# Patient Record
Sex: Male | Born: 1960
Health system: Southern US, Community
[De-identification: ages and names within clinical notes are randomized; demographics above are authoritative.]

## PROBLEM LIST (undated history)

## (undated) DIAGNOSIS — E119 Type 2 diabetes mellitus without complications: Secondary | ICD-10-CM

## (undated) DIAGNOSIS — N529 Male erectile dysfunction, unspecified: Secondary | ICD-10-CM

## (undated) HISTORY — DX: Male erectile dysfunction, unspecified: N52.9

## (undated) HISTORY — PX: NO PAST SURGERIES: SHX2092

---

## 2001-08-31 ENCOUNTER — Emergency Department (HOSPITAL_COMMUNITY): Admission: EM | Admit: 2001-08-31 | Discharge: 2001-08-31 | Payer: Self-pay | Admitting: Emergency Medicine

## 2001-08-31 ENCOUNTER — Encounter: Payer: Self-pay | Admitting: Emergency Medicine

## 2001-10-10 ENCOUNTER — Ambulatory Visit (HOSPITAL_COMMUNITY): Admission: RE | Admit: 2001-10-10 | Discharge: 2001-10-10 | Payer: Self-pay | Admitting: Gastroenterology

## 2001-10-10 ENCOUNTER — Encounter: Payer: Self-pay | Admitting: Gastroenterology

## 2003-06-02 ENCOUNTER — Encounter: Admission: RE | Admit: 2003-06-02 | Discharge: 2003-06-02 | Payer: Self-pay | Admitting: Internal Medicine

## 2003-06-23 ENCOUNTER — Encounter: Admission: RE | Admit: 2003-06-23 | Discharge: 2003-06-23 | Payer: Self-pay | Admitting: Urology

## 2003-06-26 ENCOUNTER — Ambulatory Visit (HOSPITAL_BASED_OUTPATIENT_CLINIC_OR_DEPARTMENT_OTHER): Admission: RE | Admit: 2003-06-26 | Discharge: 2003-06-26 | Payer: Self-pay | Admitting: Urology

## 2003-06-26 ENCOUNTER — Encounter (INDEPENDENT_AMBULATORY_CARE_PROVIDER_SITE_OTHER): Payer: Self-pay | Admitting: Specialist

## 2003-06-26 ENCOUNTER — Ambulatory Visit (HOSPITAL_COMMUNITY): Admission: RE | Admit: 2003-06-26 | Discharge: 2003-06-26 | Payer: Self-pay | Admitting: Urology

## 2003-09-29 ENCOUNTER — Encounter: Admission: RE | Admit: 2003-09-29 | Discharge: 2003-09-29 | Payer: Self-pay | Admitting: Internal Medicine

## 2006-12-20 ENCOUNTER — Ambulatory Visit: Payer: Self-pay | Admitting: Gastroenterology

## 2007-01-19 ENCOUNTER — Ambulatory Visit: Payer: Self-pay | Admitting: Gastroenterology

## 2007-03-30 ENCOUNTER — Ambulatory Visit: Payer: Self-pay | Admitting: Gastroenterology

## 2007-06-12 ENCOUNTER — Ambulatory Visit: Payer: Self-pay | Admitting: Gastroenterology

## 2007-08-17 DIAGNOSIS — K5732 Diverticulitis of large intestine without perforation or abscess without bleeding: Secondary | ICD-10-CM | POA: Insufficient documentation

## 2007-08-17 DIAGNOSIS — G4730 Sleep apnea, unspecified: Secondary | ICD-10-CM | POA: Insufficient documentation

## 2007-08-17 DIAGNOSIS — F1010 Alcohol abuse, uncomplicated: Secondary | ICD-10-CM | POA: Insufficient documentation

## 2008-09-12 ENCOUNTER — Telehealth: Payer: Self-pay | Admitting: Gastroenterology

## 2010-10-05 NOTE — Assessment & Plan Note (Signed)
[facility]                         GASTROENTEROLOGY OFFICE NOTE   NAME:ARNOLDWarnell, Rasnic                        MRN:          [id number]  DATE:06/12/2007                            DOB:          August 21, 1960    PROBLEM:  Recurrent diverticulitis.   Mr. Vivier has returned for scheduled followup. He underwent colonoscopy  in November 2008 that demonstrated moderately severe diffuse  diverticular changes in the left colon. Mr. Memoli has had three severe  episodes of acute diverticulitis in three years. These have been proven  by CAT scan. He currently feels well.   PHYSICAL EXAMINATION:  Pulse 72, blood pressure 112/74, weight 162.   IMPRESSION:  Recurrent acute diverticulitis. In Mr. Rupnow's relatively  young age, I think he is at risk for further attacks and complications.  On this basis, I urged him to consider elective surgery to include a  left hemicolectomy. I have referred him to Dr. Harriette Bouillon for  consideration of this.     Barbette Hair. Arlyce Dice, MD,FACG  Electronically Signed    RDK/MedQ  DD: 06/12/2007  DT: 06/12/2007  Job #: [id number]   cc:   Thomas A. Cornett, M.D.  Dr. Katrinka Blazing

## 2010-10-05 NOTE — Assessment & Plan Note (Signed)
[facility]                         GASTROENTEROLOGY OFFICE NOTE   NAME:[name], [name]                        MRN:          [id number]  DATE:01/19/2007                            DOB:          25-Jan-1961    PROBLEM:  Diverticulitis.   Mr. Chatmon has returned for scheduled followup. He is now entirely  symptom-free. He is having normal bowel movements and has no GI  complaints.   PHYSICAL EXAMINATION:  Pulse 72, blood pressure 108/76, weight 157.   IMPRESSION:  1. Acute diverticulitis-resolved. It is noteworthy that he has had 3      episodes in 3-4 years.  2. Diabetes.  3. History of alcohol abuse.   RECOMMENDATIONS:  Colonoscopy. I would give some consideration to  elective bowel resection in view of his frequency of his attacks.     Barbette Hair. Arlyce Dice, MD,FACG  Electronically Signed    RDK/MedQ  DD: 01/19/2007  DT: 01/21/2007  Job #: [id number]

## 2010-10-05 NOTE — Assessment & Plan Note (Signed)
[facility]                         GASTROENTEROLOGY OFFICE NOTE   NAME:[name], [name]                        MRN:          [id number]  DATE:12/20/2006                            DOB:          06-Jul-1960    PROBLEM:  Abdominal pain.   Mr. Todd [name] is a pleasant 50 year old white male referred through the  courtesy of Dr. Katrinka Blazing for evaluation.  Over the last 3-4 days, he has  had moderately severe left lower quadrant pain with nausea and vomiting.  He was seen at urgent care where a CT scan demonstrated findings  consistent with sigmoid diverticulitis.  Specifically, there was  stranding of the pelvic fat and a small amount of free fluid in the  anterior pelvis measuring 2.3 x 3.8 x 3.1 cm.  There was also a minimal  amount of fluid seen in the pelvis adjacent to the rectum.  No free air  was seen (images are not available for examination).  Since starting  Cipro and Flagyl yesterday, his abdominal pain has significantly  improved.  He has had no further nausea or vomiting.  He is without  fever.  This is his third episode of diverticulitis in as many years.   PAST MEDICAL HISTORY:  1. Diabetes.  2. Sleep apnea.   PAST SURGICAL HISTORY:  He is status post left orchiectomy.   SOCIAL HISTORY:  He is a former alcohol abuser, but now admits to  drinking only a 6-pack a week.  He does not smoke.  He is single and  works as a Engineer, drilling.   FAMILY HISTORY:  Noncontributory.   MEDICATIONS:  1. Metronidazole 500 mg daily.  2. Ciprofloxacin 750 mg b.i.d.   ALLERGIES:  He has no allergies.   REVIEW OF SYSTEMS:  Reviewed and is negative.   PHYSICAL EXAMINATION:  VITAL SIGNS:  Pulse 100, blood pressure 120/78,  weight 154.  HEENT:  Extraocular movements intact.  Pupils are equal, round and  reactive to light and accommodation.  Sclerae are anicteric.  Conjunctivae are pink.  He has spotted telangiectasias on his face.  NECK:  Supple without  thyromegaly, adenopathy or carotid bruits.  CHEST:  Clear to auscultation and percussion without adventitious  sounds.  CARDIAC:  Regular rhythm; normal S1 S2.  There are no murmurs, gallops  or rubs.  ABDOMEN:  Bowel sounds are normoactive.  Abdomen is soft, non-tender and  non-distended.  There are no abdominal masses, tenderness, splenic  enlargement or hepatomegaly.  EXTREMITIES:  Full range of motion.  No cyanosis, clubbing or edema.  RECTAL:  Deferred.   IMPRESSION:  1. Acute diverticulitis with possible acute diverticulitis.  There is      no evidence for pre-perforation.  2. History of alcohol abuse.  3. Diabetes.   RECOMMENDATION:  1. Continue Cipro and Flagyl.  The patient was carefully instructed to      contact me if pain worsens, at which point I would place him on      intravenous antibiotics.  2. Follow up colonoscopy in approximately 4 weeks.  3. Consider elective resection in view  of his recurrent      diverticulitis.  4. Increase Flagyl to three times a day.     Barbette Hair. Arlyce Dice, MD,FACG  Electronically Signed    RDK/MedQ  DD: 12/20/2006  DT: 12/21/2006  Job #: [id number]   cc:   Dr. Katrinka Blazing

## 2010-10-08 NOTE — Op Note (Signed)
NAME:  Todd [name], Todd [name]                           ACCOUNT NO.:  [id number]   MEDICAL RECORD NO.:  [id number]                   PATIENT TYPE:  AMB   LOCATION:  [facility]                                 FACILITY:  [facility]   PHYSICIAN:  Excell Seltzer. Annabell Howells, M.D.                 DATE OF BIRTH:  19-Jul-1960   DATE OF PROCEDURE:  06/26/2003  DATE OF DISCHARGE:                                 OPERATIVE REPORT   PREOPERATIVE DIAGNOSES:  1. Left testicular mass.  2. Microhematuria.   POSTOPERATIVE DIAGNOSES:  1. Left testicular mass.  2. Microhematuria.   PROCEDURES:  1. Flexible cystoscopy.  2. Left radical orchiectomy.  3. Insertion of testicular prosthesis.   SURGEON:  Excell Seltzer. Annabell Howells, M.D.   ANESTHESIA:  General.   SPECIMENS:  Left testicle and cord.   COMPLICATIONS:  None.   INDICATIONS:  Todd [name] is a 50 year old white male who presented with  acute left scrotal pain and a mass.  He was initially found on ultrasound to  have a hypoechoic lesion of the testicle.  He was given an empiric course of  antibiotic for two weeks.  A follow-up visit demonstrated a persistent mass.  The ultrasound revealed a solid lesion in the upper pole of the left  testicle with blood flow suspicious for a testicular neoplasm.  It was felt  that left radical orchiectomy was indicated.  The patient has elected to  undergo insertion of a prosthesis as well.  He also had microhematuria and  is to undergo flexible cystoscopy as part of his workup completion.  He had  a CT scan that showed some diverticulosis but no other abnormalities and a  chest x-ray that was unremarkable.   FINDINGS AND PROCEDURE:  The patient was given 1 g of Ancef, 400 mg of Cipro  IV.  He was taken to the operating room, where a general anesthetic was  induced.  His left inguinal area was shaved.  He was initially prepped with  Betadine solution and flexible cystoscopy was performed with a 16 [place]  scope.  Examination revealed a normal  urethra.  The prostatic urethra was  short without obstruction.  Examination of the bladder revealed a smooth  wall without tumor, stones, or inflammation.  The ureteral orifices were in  their normal anatomic position, effluxing clear urine.   After completion of the cystoscopy, the patient was reprepped with Betadine  scrub for five minutes, followed by Betadine solution.  He was then draped  in the usual sterile fashion.  A left inguinal incision was made with the  knife.  This was carried down through the subcutaneous tissues with the  Bovie.  The external oblique fascia was identified overlying the cord.  This  was incised along the direction of its fibers with a knife and then  dissected off the cord.  The ilioinguinal nerve was identified and  protected.  The testicle was then delivered from the scrotum.  The  gubernacular attachments were taken down with the Bovie.  The cord had been  clamped with a quarter-inch Penrose drain prior to manipulating the  testicle.  Once the testicle was freed from the scrotum, the cord was  divided into three packets and divided.  Each packet of the pedicle was  doubly ligated with 0 Vicryl ties.  At this point the wound was irrigated  and hemostasis was assured.  The dependent portion of the scrotum was  brought up into the wound and a 2-0 Prolene stitch was placed through the  scrotal wall with great care being taken to avoid perforation of the  epidermis.  The testicular prosthesis, which was a Mentor large 2.9 x 4.5 cm  prosthesis, was prepared by filling with 19 mL of sterile saline.  It was  then soaked in antibiotic solution.  The wound was re-irrigated with  antibiotic solution and the testicular prosthesis was then secured with the  2-0 Prolene to the scrotal wall.  The suture was trimmed and the testicle  was dropped back into an anatomic position with excellent cosmetic results.  At this point the external oblique fascia was closed with a  running 3-0  Vicryl stitch.  Care was taken to avoid injury to the ilioinguinal nerve.  Once final hemostasis was assured, the incision was infiltrated with 10 mL  of 0.25% Marcaine and the subcutaneous tissue was reapproximated with 3-0  Vicryl stitches.  The skin was then closed with a 4-0 Vicryl intracuticular  stitch and the wound was reinforced with Steri-Strips.  The patient was  given a dressing and a scrotal support.  His anesthetic was reversed.  He  was admitted to the recovery room in stable condition.  There were no  complications.                                               Excell Seltzer. Annabell Howells, M.D.    JJW/MEDQ  D:  06/26/2003  T:  06/26/2003  Job:  [id number]   cc:   Wanda Plump, MD [facility]  [phone] [address] [place], [place] [address]

## 2011-10-24 ENCOUNTER — Ambulatory Visit (INDEPENDENT_AMBULATORY_CARE_PROVIDER_SITE_OTHER): Payer: BC Managed Care – PPO | Admitting: Internal Medicine

## 2011-10-24 VITALS — BP 128/85 | HR 90 | Temp 98.1°F | Resp 16 | Ht 69.0 in | Wt 165.0 lb

## 2011-10-24 DIAGNOSIS — R7309 Other abnormal glucose: Secondary | ICD-10-CM

## 2011-10-24 DIAGNOSIS — E119 Type 2 diabetes mellitus without complications: Secondary | ICD-10-CM | POA: Insufficient documentation

## 2011-10-24 DIAGNOSIS — R351 Nocturia: Secondary | ICD-10-CM

## 2011-10-24 DIAGNOSIS — E291 Testicular hypofunction: Secondary | ICD-10-CM | POA: Insufficient documentation

## 2011-10-24 DIAGNOSIS — R739 Hyperglycemia, unspecified: Secondary | ICD-10-CM

## 2011-10-24 DIAGNOSIS — R81 Glycosuria: Secondary | ICD-10-CM

## 2011-10-24 LAB — POCT URINALYSIS DIPSTICK
Bilirubin, UA: NEGATIVE
Glucose, UA: 1000
Spec Grav, UA: 1.015
pH, UA: 5

## 2011-10-24 LAB — POCT CBC
Granulocyte percent: 60.1 %G (ref 37–80)
HCT, POC: 45.8 % (ref 43.5–53.7)
MCV: 92.4 fL (ref 80–97)
POC Granulocyte: 3.6 (ref 2–6.9)
POC LYMPH PERCENT: 32.8 %L (ref 10–50)
RBC: 4.96 M/uL (ref 4.69–6.13)
RDW, POC: 13.7 %

## 2011-10-24 LAB — POCT UA - MICROSCOPIC ONLY
Bacteria, U Microscopic: NEGATIVE
Crystals, Ur, HPF, POC: NEGATIVE
Epithelial cells, urine per micros: NEGATIVE

## 2011-10-24 LAB — POCT GLYCOSYLATED HEMOGLOBIN (HGB A1C): Hemoglobin A1C: 10.2

## 2011-10-24 MED ORDER — METFORMIN HCL ER (OSM) 1000 MG PO TB24: 1000.0000 mg | ORAL_TABLET | Freq: Every day | ORAL | Status: DC

## 2011-10-24 NOTE — Patient Instructions (Addendum)
You have type 2 diabetes and are not at risk for hypoglycemia/Hemoglobin A1c 10% Begin metformin 1000 mg extended release at breakfast daily Reduce carbohydrate intake especially carbohydrates from alcohol We are checking liver function studies, lipid profile, and urine microalbumin and we'll mail you results Recheck fasting blood sugar on Saturday, June 15 at 8 AM

## 2011-10-24 NOTE — Progress Notes (Signed)
Subjective:    Patient ID: Todd [name], male    DOB: December 27, 1960, 51 y.o.   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       Dry mouth  HENT: Negative for trouble swallowing and voice change.   Eyes: Positive for visual disturbance.       2 weeks ago=glasses  Respiratory: Negative for shortness of breath and wheezing.   Cardiovascular: Negative for chest pain, palpitations and leg swelling.  Gastrointestinal: Negative for nausea, diarrhea, constipation and blood in stool.  Genitourinary: Positive for frequency. Negative for dysuria and difficulty urinating.       Nocturia   Skin: Negative for rash.  Neurological: Negative for dizziness and weakness.       Numbness/tingling in feet-very intermittent No gait changes       Objective:   Physical Exam  Constitutional: He is oriented to person, place, and time. He appears well-developed and well-nourished.  HENT:  Head: Normocephalic.  Mouth/Throat: Oropharynx is clear and moist and mucous membranes are normal. No oral lesions. No dental caries.  Eyes: Conjunctivae, EOM and lids are normal. Pupils are equal, round, and reactive to light.  Neck: Carotid bruit is not present. No thyromegaly present.  Cardiovascular: Normal rate, regular rhythm, S1 normal and S2 normal.  Exam reveals no gallop.   No murmur heard. Pulmonary/Chest: Effort normal and breath sounds normal.  Musculoskeletal: He exhibits no edema.  Neurological: He is alert and oriented to person, place, and time. He has normal  reflexes. No cranial nerve deficit or sensory deficit.  Skin: Skin is intact.          Results for orders placed in visit on 10/24/11  POCT CBC      Component Value Range   WBC 6.0  4.6 - 10.2 (K/uL)   Lymph, poc 2.0  0.6 - 3.4    POC LYMPH PERCENT 32.8  10 - 50 (%L)   MID (cbc) 0.4  0 - 0.9    POC MID % 7.1  0 - 12 (%M)   POC Granulocyte 3.6  2 - 6.9    Granulocyte percent 60.1  37 - 80 (%G)   RBC 4.96  4.69 - 6.13 (M/uL)   Hemoglobin 15.2  14.1 - 18.1 (g/dL)   HCT, POC 40.9  81.1 - 53.7 (%)   MCV 92.4  80 - 97 (fL)   MCH, POC 30.6  27 - 31.2 (pg)   MCHC 33.2  31.8 - 35.4 (g/dL)   RDW, POC 91.4     Platelet Count, POC 236  142 - 424 (K/uL)   MPV 7.9  0 - 99.8 (fL)  POCT GLYCOSYLATED HEMOGLOBIN (HGB A1C)      Component Value Range   Hemoglobin A1C 10.2    POCT URINALYSIS DIPSTICK      Component Value Range   Color, UA yellow     Clarity, UA clear     Glucose, UA 1000     Bilirubin, UA neg     Ketones, UA trace     Spec Grav,  UA 1.015     Blood, UA trace     pH, UA 5.0     Protein, UA neg     Urobilinogen, UA 0.2     Nitrite, UA neg     Leukocytes, UA Negative    POCT UA - MICROSCOPIC ONLY      Component Value Range   WBC, Ur, HPF, POC neg     RBC, urine, microscopic 0-1     Bacteria, U Microscopic neg     Mucus, UA neg     Epithelial cells, urine per micros neg     Crystals, Ur, HPF, POC neg     Casts, Ur, LPF, POC mneg     Yeast, UA neg      Assessment & Plan:  Problem #1 new onset type 2 diabetes mellitus-Peripheral sensory complaint not discovered on exam and should not be related to his diabetes this early in the course of the disease unless it has been hidden from him for long time  Metformin 1000 extended release taken daily with breakfast #30 with 2 refills Check lipid profile,Metabolic profile, TSH, urine microalbumin Decrease carbohydrate load by stopping alcohol No other dietary restrictions at this point since his BMI is appropriate No need to  check blood sugars at home Recheck fasting blood sugar 11/05/11 8am

## 2011-10-25 LAB — COMPREHENSIVE METABOLIC PANEL
ALT: 79 U/L — ABNORMAL HIGH (ref 0–53)
Albumin: 4.6 g/dL (ref 3.5–5.2)
Alkaline Phosphatase: 57 U/L (ref 39–117)
CO2: 24 mEq/L (ref 19–32)
Potassium: 4.9 mEq/L (ref 3.5–5.3)
Sodium: 137 mEq/L (ref 135–145)
Total Bilirubin: 1.3 mg/dL — ABNORMAL HIGH (ref 0.3–1.2)
Total Protein: 8.1 g/dL (ref 6.0–8.3)

## 2011-10-25 LAB — LIPID PANEL
LDL Cholesterol: 128 mg/dL — ABNORMAL HIGH (ref 0–99)
VLDL: 38 mg/dL (ref 0–40)

## 2011-10-25 LAB — TSH: TSH: 1.078 u[IU]/mL (ref 0.350–4.500)

## 2011-10-28 ENCOUNTER — Encounter: Payer: Self-pay | Admitting: Internal Medicine

## 2011-11-05 ENCOUNTER — Encounter: Payer: Self-pay | Admitting: Internal Medicine

## 2011-11-05 ENCOUNTER — Ambulatory Visit (INDEPENDENT_AMBULATORY_CARE_PROVIDER_SITE_OTHER): Payer: BC Managed Care – PPO | Admitting: Internal Medicine

## 2011-11-05 VITALS — BP 140/82 | HR 76 | Temp 98.2°F | Resp 18 | Ht 68.5 in | Wt 165.0 lb

## 2011-11-05 DIAGNOSIS — E119 Type 2 diabetes mellitus without complications: Secondary | ICD-10-CM

## 2011-11-05 MED ORDER — METFORMIN HCL 850 MG PO TABS: 850.0000 mg | ORAL_TABLET | Freq: Two times a day (BID) | ORAL | Status: DC

## 2011-11-05 MED ORDER — POLYETHYLENE GLYCOL 3350 17 GM/SCOOP PO POWD: 17.0000 g | Freq: Every day | ORAL | Status: AC

## 2011-11-05 NOTE — Progress Notes (Signed)
  Subjective:    Patient ID: Todd [name], male    DOB: 06/06/1960, 51 y.o.   MRN: [id number]  HPIFollowup for new-onset diabetes Fortamet 1000 daily has caused diarrhea and constipation with some dyspepsia but this has slowly gotten better No other side effects He is beginning to reduce his alcohol intake He has numbness on one finger His complaints of numbness in his feet have disappeared    Review of Systems     Objective:   Physical Exam The left index finger has some swelling suggesting a small foreign body near the DIP crease       Results for orders placed in visit on 11/05/11  GLUCOSE, POCT (MANUAL RESULT ENTRY)      Component Value Range   POC Glucose 189 (*) 70 - 99 mg/dl    Assessment & Plan:  Problem #1 new onset diabetes Problem #2 mildly abnormal liver function studies Problem #3 excessive EtOH Problem #4 mild hyperlipidemia  Increase his metformin to 850 twice a day watching for GI side effects #62 refills Check A1c in 3 months with recheck of lipids and liver functions We'll need to consider ACE inhibitors and statins at that point and urine microalbumin He will add fiber to his diet and use MiraLax if he continues to have intermittent constipation

## 2012-04-08 ENCOUNTER — Encounter (HOSPITAL_BASED_OUTPATIENT_CLINIC_OR_DEPARTMENT_OTHER): Payer: Self-pay | Admitting: Emergency Medicine

## 2012-04-08 ENCOUNTER — Emergency Department (HOSPITAL_BASED_OUTPATIENT_CLINIC_OR_DEPARTMENT_OTHER): Payer: BC Managed Care – PPO

## 2012-04-08 ENCOUNTER — Emergency Department (HOSPITAL_BASED_OUTPATIENT_CLINIC_OR_DEPARTMENT_OTHER)
Admission: EM | Admit: 2012-04-08 | Discharge: 2012-04-08 | Disposition: A | Discharge status: Home / Self Care | Payer: BC Managed Care – PPO | Attending: Emergency Medicine | Admitting: Emergency Medicine

## 2012-04-08 DIAGNOSIS — Y939 Activity, unspecified: Secondary | ICD-10-CM | POA: Insufficient documentation

## 2012-04-08 DIAGNOSIS — Y929 Unspecified place or not applicable: Secondary | ICD-10-CM | POA: Insufficient documentation

## 2012-04-08 DIAGNOSIS — Z79899 Other long term (current) drug therapy: Secondary | ICD-10-CM | POA: Insufficient documentation

## 2012-04-08 DIAGNOSIS — IMO0001 Reserved for inherently not codable concepts without codable children: Secondary | ICD-10-CM | POA: Insufficient documentation

## 2012-04-08 DIAGNOSIS — S8990XA Unspecified injury of unspecified lower leg, initial encounter: Secondary | ICD-10-CM | POA: Insufficient documentation

## 2012-04-08 DIAGNOSIS — E119 Type 2 diabetes mellitus without complications: Secondary | ICD-10-CM | POA: Insufficient documentation

## 2012-04-08 DIAGNOSIS — IMO0002 Reserved for concepts with insufficient information to code with codable children: Secondary | ICD-10-CM | POA: Insufficient documentation

## 2012-04-08 DIAGNOSIS — T148XXA Other injury of unspecified body region, initial encounter: Secondary | ICD-10-CM

## 2012-04-08 DIAGNOSIS — S8010XA Contusion of unspecified lower leg, initial encounter: Secondary | ICD-10-CM | POA: Insufficient documentation

## 2012-04-08 HISTORY — DX: Type 2 diabetes mellitus without complications: E11.9

## 2012-04-08 MED ORDER — OXYCODONE-ACETAMINOPHEN 5-325 MG PO TABS
2.0000 | ORAL_TABLET | Freq: Once | ORAL | Status: AC
  Administered 2012-04-08: 2 via ORAL
  Filled 2012-04-08 (×2): qty 2

## 2012-04-08 MED ORDER — OXYCODONE-ACETAMINOPHEN 5-325 MG PO TABS: 2.0000 | ORAL_TABLET | ORAL | Status: DC | PRN

## 2012-04-08 NOTE — ED Notes (Signed)
Pt states a piece of metal hit back of calf muscle on Tuesday.  Pt having swelling, inflammation which is worsening.  Pt seen at urgent care this am and was sent here for ultrasound for possible blood clot and continued care.

## 2012-04-08 NOTE — ED Provider Notes (Signed)
History     CSN: [id number]  Arrival date & time 04/08/12  1222   First MD Initiated Contact with Patient 04/08/12 1257      Chief Complaint  Patient presents with  . Wound Check  . Leg Swelling    (Consider location/radiation/quality/duration/timing/severity/associated sxs/prior treatment) HPI Comments: Patient is a 51 year old male who presents with right lower extremity pain and swelling. Patient reports being hit with a heavy piece of metal on his right calf 5 days ago, which left an abrasion to his right calf. Since the injury the patient has experienced gradual onset of throbbing, severe pain that is constant and progressively worsening. Walking makes the pain worse. Nothing makes the pain better. Patient has not tried anything for symptom relief. Patient reports associated progressive swelling of his right lower extremity. 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 Patient is a non smoker, no recent surgery. Patient reports associated bruising of affected area. Patient denies headache, visual changes, SOB, chest pain, abdominal pain, any other injury, numbness/tingling of extremities.    Past Medical History  Diagnosis Date  . Diabetes mellitus without complication     No past surgical history on file.  No family history on file.  History  Substance Use Topics  . Smoking status: Never Smoker   . Smokeless tobacco: Not on file  . Alcohol Use: Not on file      Review of Systems  Cardiovascular: Positive for leg swelling.  Musculoskeletal: Positive for myalgias.  Skin: Positive for color change.  All other systems reviewed and are negative.    Allergies  Review of patient's allergies indicates no known allergies.  Home Medications   Current Outpatient Rx  Name  Route  Sig  Dispense  Refill  . METFORMIN HCL 850 MG PO TABS   Oral   Take 1 tablet (850 mg total) by mouth 2 (two) times daily with a meal.   60 tablet   2   . TADALAFIL  20 MG PO TABS   Oral   Take 20 mg by mouth daily as needed.         . TESTOSTERONE 50 MG/5GM TD GEL   Transdermal   Place 5 g onto the skin daily.           BP 133/102  Pulse 88  Temp 97.7 F (36.5 C) (Oral)  Resp 18  Ht 5\' 9"  (1.753 m)  Wt 166 lb (75.297 kg)  BMI 24.51 kg/m2  SpO2 100%  Physical Exam  Nursing note and vitals reviewed. Constitutional: He appears well-developed and well-nourished. No distress.  HENT:  Head: Normocephalic and atraumatic.  Mouth/Throat: No oropharyngeal exudate.  Eyes: Conjunctivae normal and EOM are normal. No scleral icterus.  Neck: Normal range of motion. Neck supple.  Cardiovascular: Normal rate and regular rhythm.  Exam reveals no gallop and no friction rub.   No murmur heard. Pulmonary/Chest: Effort normal and breath sounds normal. He has no wheezes. He has no rales. He exhibits no tenderness.  Abdominal: Soft. He exhibits no distension.  Musculoskeletal: Normal range of motion.       Diffusely edematous right lower extremity and right foot that is tender to palpation over right calf. Abrasion noted over right calf overlying diffuse bruising that covers the right calf and extends to popliteal region.   Neurological: He is alert.       Speech is goal-oriented. Moves limbs without ataxia.   Skin:  Skin is warm and dry. He is not diaphoretic.       Abrasion over right calf. No purulent drainage, open wound, surrounding erythema noted.   Psychiatric: He has a normal mood and affect. His behavior is normal.    ED Course  Procedures (including critical care time)  Labs Reviewed - No data to display US Venous Img Lower Unilateral Right  04/08/2012  *RADIOLOGY REPORT*  Clinical Data: Right calf pain/swelling, recent injury  RIGHT LOWER EXTREMITY VENOUS DUPLEX ULTRASOUND  Technique:  Gray-scale sonography with graded compression, as well as color Doppler and duplex ultrasound, were performed to evaluate the deep venous system of the lower  extremity from the level of the common femoral vein through the popliteal and proximal calf veins. Spectral Doppler was utilized to evaluate flow at rest and with distal augmentation maneuvers.  Comparison:  None.  Findings: The visualized right lower extremity deep venous system appears patent.  Normal compressibility.  Patent color Doppler flow.  Satisfactory spectral Doppler with respiratory variation and response to augmentation.  The greater saphenous vein, where visualized, is patent and compressible.  IMPRESSION: No deep venous thrombosis in the visualized right lower extremity.   Original Report Authenticated By: Charline Bills, M.D.      1. Blunt trauma of lower leg   2. Muscle contusion       MDM  1:06 PM Patient will have Percocet for pain and Right lower extremity ultrasound to rule out DVT due to recent travel history.   2:32 PM [place] negative for DVT. Patient likely has severe bruising from blunt trauma. Patient instructed to rest, ice, and elevate injury. I will discharge patient with pain medication. Patient instructed to return with acutely worsening symptoms. No further evaluation needed at this time.      Emilia Beck, PA-C 04/08/12 1436

## 2012-04-16 NOTE — ED Provider Notes (Signed)
History     CSN: [id number]  Arrival date & time 04/08/12  1222   First MD Initiated Contact with Patient 04/08/12 1257      Chief Complaint  Patient presents with  . Wound Check  . Leg Swelling    (Consider location/radiation/quality/duration/timing/severity/associated sxs/prior treatment) HPI  Past Medical History  Diagnosis Date  . Diabetes mellitus without complication     No past surgical history on file.  No family history on file.  History  Substance Use Topics  . Smoking status: Never Smoker   . Smokeless tobacco: Not on file  . Alcohol Use: Not on file      Review of Systems  Allergies  Review of patient's allergies indicates no known allergies.  Home Medications   Current Outpatient Rx  Name  Route  Sig  Dispense  Refill  . METFORMIN HCL 850 MG PO TABS   Oral   Take 1 tablet (850 mg total) by mouth 2 (two) times daily with a meal.   60 tablet   2   . OXYCODONE-ACETAMINOPHEN 5-325 MG PO TABS   Oral   Take 2 tablets by mouth every 4 (four) hours as needed for pain.   10 tablet   0   . TADALAFIL 20 MG PO TABS   Oral   Take 20 mg by mouth daily as needed.         . TESTOSTERONE 50 MG/5GM TD GEL   Transdermal   Place 5 g onto the skin daily.           BP 133/102  Pulse 88  Temp 97.7 F (36.5 C) (Oral)  Resp 18  Ht 5\' 9"  (1.753 m)  Wt 166 lb (75.297 kg)  BMI 24.51 kg/m2  SpO2 100%  Physical Exam  ED Course  Procedures (including critical care time)  Labs Reviewed - No data to display No results found.   1. Blunt trauma of lower leg   2. Muscle contusion       MDM  Medical screening examination/treatment/procedure(s) were conducted as a shared visit with non-physician practitioner(s) and myself.  I personally evaluated the patient during the encounter    Pt s/p contusion lower leg. Soreness persists, but slowly improving. No numbness or weakness. No increase in swelling. Compartments of lower leg appear soft/not  tense. No pain w plantar or dorsiflexion at ankle. Distal pulses palp. Normal cap refill. Pain controlled.         Suzi Roots, MD 04/16/12 [phone]

## 2012-04-18 NOTE — ED Provider Notes (Signed)
Medical screening examination/treatment/procedure(s) were conducted as a shared visit with non-physician practitioner(s) and myself.  I personally evaluated the patient during the encounter Recent contusion to leg. Persistent soreness/swelling since. No abrupt change in pain or uncontrolled pain. No numbness/weakness. Worse w palpation. No sob.  Compartments soft, not tense. Good rom, plantar and dorsiflexion at ankle comfortably, distal pulses palp, pain controlled.   Suzi Roots, MD 04/18/12 [phone]

## 2012-04-30 ENCOUNTER — Other Ambulatory Visit: Payer: Self-pay | Admitting: Internal Medicine

## 2012-05-02 ENCOUNTER — Telehealth: Payer: Self-pay

## 2012-05-02 NOTE — Telephone Encounter (Signed)
PT SCHEDULE APPT FOR 12/27 WITH DR Beverely Low METFORMIN REFILLED    PT PHONE [phone]

## 2012-05-03 MED ORDER — METFORMIN HCL 850 MG PO TABS: 850.0000 mg | ORAL_TABLET | Freq: Two times a day (BID) | ORAL | Status: DC

## 2012-05-03 NOTE — Telephone Encounter (Signed)
Sent in for him to [address]

## 2012-05-18 ENCOUNTER — Ambulatory Visit (INDEPENDENT_AMBULATORY_CARE_PROVIDER_SITE_OTHER): Payer: BC Managed Care – PPO | Admitting: Family Medicine

## 2012-05-18 ENCOUNTER — Encounter: Payer: Self-pay | Admitting: Family Medicine

## 2012-05-18 VITALS — BP 142/94 | HR 100 | Temp 97.9°F | Resp 18 | Ht 68.5 in | Wt 165.0 lb

## 2012-05-18 DIAGNOSIS — Z79899 Other long term (current) drug therapy: Secondary | ICD-10-CM

## 2012-05-18 DIAGNOSIS — R7989 Other specified abnormal findings of blood chemistry: Secondary | ICD-10-CM

## 2012-05-18 DIAGNOSIS — F1010 Alcohol abuse, uncomplicated: Secondary | ICD-10-CM

## 2012-05-18 DIAGNOSIS — E119 Type 2 diabetes mellitus without complications: Secondary | ICD-10-CM

## 2012-05-18 DIAGNOSIS — IMO0001 Reserved for inherently not codable concepts without codable children: Secondary | ICD-10-CM

## 2012-05-18 DIAGNOSIS — H1013 Acute atopic conjunctivitis, bilateral: Secondary | ICD-10-CM

## 2012-05-18 DIAGNOSIS — Z23 Encounter for immunization: Secondary | ICD-10-CM

## 2012-05-18 DIAGNOSIS — K5732 Diverticulitis of large intestine without perforation or abscess without bleeding: Secondary | ICD-10-CM

## 2012-05-18 DIAGNOSIS — I10 Essential (primary) hypertension: Secondary | ICD-10-CM

## 2012-05-18 LAB — COMPREHENSIVE METABOLIC PANEL
Albumin: 5 g/dL (ref 3.5–5.2)
Alkaline Phosphatase: 53 U/L (ref 39–117)
BUN: 16 mg/dL (ref 6–23)
Calcium: 9.5 mg/dL (ref 8.4–10.5)
Chloride: 100 mEq/L (ref 96–112)
Creat: 0.92 mg/dL (ref 0.50–1.35)
Glucose, Bld: 198 mg/dL — ABNORMAL HIGH (ref 70–99)
Potassium: 4.3 mEq/L (ref 3.5–5.3)

## 2012-05-18 LAB — HEPATITIS C ANTIBODY: HCV Ab: NEGATIVE

## 2012-05-18 LAB — HEPATITIS B SURFACE ANTIGEN: Hepatitis B Surface Ag: NEGATIVE

## 2012-05-18 LAB — LIPID PANEL
Cholesterol: 213 mg/dL — ABNORMAL HIGH (ref 0–200)
Total CHOL/HDL Ratio: 4.5 Ratio
Triglycerides: 177 mg/dL — ABNORMAL HIGH (ref <150)

## 2012-05-18 MED ORDER — CROMOLYN SODIUM 4 % OP SOLN: 1.0000 [drp] | Freq: Four times a day (QID) | OPHTHALMIC | Status: DC

## 2012-05-18 MED ORDER — METFORMIN HCL ER (OSM) 1000 MG PO TB24: 2000.0000 mg | ORAL_TABLET | Freq: Every day | ORAL | Status: DC

## 2012-05-18 MED ORDER — LISINOPRIL 5 MG PO TABS: 5.0000 mg | ORAL_TABLET | Freq: Every day | ORAL | Status: DC

## 2012-05-18 NOTE — Patient Instructions (Addendum)
Diabetes and Standards of Medical Care  Diabetes is complicated. You may find that your diabetes team includes a dietitian, nurse, diabetes educator, eye doctor, and more. To help everyone know what is going on and to help you get the care you deserve, the following schedule of care was developed to help keep you on track. Below are the tests, exams, vaccines, medicines, education, and plans you will need. A1c test  Performed at least 2 times a year if you are meeting treatment goals.  Performed 4 times a year if therapy has changed or if you are not meeting therapy/glycemic goals. Aspirin medicine  Take daily as directed by your caregiver. Blood pressure test  Performed at every routine medical visit. The goal is less than 130/80 mm/Hg. Dental exam  Get a dental exam at least 2 times a year. Dilated eye exam (retinal exam)  Type 1 diabetes: Get an exam within 5 years of diagnosis and then yearly.  Type 2 diabetes: Get an exam at diagnosis and then yearly. All exams thereafter can be extended to every 2 to 3 years if one or more exams have been normal. Foot care exam  Visual foot exams are performed at every routine medical visit. The exams check for cuts, injuries, or other problems with the feet.  A comprehensive foot exam should be done yearly. This includes visual inspection as well as assessing foot pulses and testing for loss of sensation. Kidney function test (urine microalbumin)  Performed once a year.  Type 1 diabetes: The first test is performed 5 years after diagnosis.  Type 2 diabetes: The first test is performed at the time of diagnosis.  A serum creatinine and estimated glomerular filtration rate (eGFR) test is done once a year to tell the level of chronic kidney disease (CKD), if present. Lipid profile (Cholesterol, HDL, LDL, Triglycerides)  Performed once a year for most people. If at low risk, may be assessed every 2 years.  The goal for LDL is less than 100  mg/dl. If at high risk, the goal is less than 70 mg/dl.  The goal for HDL is higher than 40 mg/dl for men and higher than 50 mg/dl for women.  The goal for triglycerides is less than 150 mg/dl. Flu vaccine, pneumonia vaccine, and hepatitis B vaccine  The flu vaccine is recommended yearly.  The pneumonia vaccine is generally given once in a lifetime. However, there are some instances where another vaccine is recommended. Check with your caregiver.  The hepatitis B vaccine is also recommended for adults with diabetes. Diabetes self-management education  Recommended at diagnosis and ongoing as needed. Treatment plan  Reviewed at every medical visit. Document Released: 03/06/2009 Document Revised: 08/01/2011 Document Reviewed: 11/09/2010 [employer] Patient Information 2013 [employer].  

## 2012-05-18 NOTE — Progress Notes (Signed)
Subjective:    Patient ID: Todd [name], male    DOB: October 27, 1960, 51 y.o.   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  He was initially placed on xr metformin but caused constipation so he was transitioned to bid metformin 850.  However, this is really upsetting stomach and causing diarrhea which is not compatible with driving so missing both doses about 2-3 days/wk, so essentially only taking a little more than about half the time.  Occasionally able to take it every day, sometimes forgets.  Eats irregularly - usually only 2 meals and those meals vary.  Not checking sugars.  Doesn't think it is necessary.  Many of pt's family members have DM so he feels well acquainted with this illness and is not particularly worried about keeping it controlled.  I tried to reinforce to pt the importance of compliance as if he developed any poor outcomes or DM worsened to the point where he had to go on insulin - he would loose his DOT card and be unemployed - pt agreed that we need to work together to avoid this.  Also reviewed that he should at least keep a meter w/ him - even if he refused to use it, sometimes very high or low sugars could have similar symptoms - nausea, dehydration, fatigue - and it would be important for him to distinguish the cause of his symptoms - which he also agreed.  C/o eyes crusting when he wakes up and watery discharge throughout the day. Has a lot of problems with allergies and sinus congestion but really doesn't like to do anything about them as it doesn't help in the long term.  Drinking alcohol occasionally - a few beers a few times/wk. Did drink last night.  Review of Systems  Constitutional: Negative for fever and chills.  Eyes: Positive for discharge and itching. Negative for pain, redness and visual disturbance.  Respiratory: Negative for shortness of breath.   Cardiovascular: Negative for chest pain and leg  swelling.  Gastrointestinal: Positive for nausea, abdominal pain and diarrhea. Negative for vomiting and constipation.  Neurological: Negative for dizziness, syncope, facial asymmetry, weakness, light-headedness and headaches.        BP 142/94  Pulse 100  Temp 97.9 F (36.6 C) (Oral)  Resp 18  Ht 5' 8.5" (1.74 m)  Wt 165 lb (74.844 kg)  BMI 24.72 kg/m2  SpO2 100% Objective:   Physical Exam  Constitutional: He is oriented to person, place, and time. He appears well-developed and well-nourished. No distress.  HENT:  Head: Normocephalic and atraumatic.  Eyes: Conjunctivae normal are normal. Pupils are equal, round, and reactive to light. No scleral icterus.  Neck: Normal range of motion. Neck supple. No thyromegaly present.  Cardiovascular: Normal rate, regular rhythm, normal heart sounds and intact distal pulses.   Pulmonary/Chest: Effort normal and breath sounds normal. No respiratory distress.  Musculoskeletal: He exhibits no edema.  Lymphadenopathy:    He has no cervical adenopathy.  Neurological: He is alert and oriented to person, place, and time. He has normal strength. He displays no atrophy. No sensory deficit. He exhibits normal muscle tone. Coordination and gait normal.  Skin: Skin is warm and dry. No rash noted. He is not diaphoretic. No erythema.  Psychiatric: He has a normal mood and affect. His behavior is normal.       Results for orders placed in visit on 05/18/12  POCT GLYCOSYLATED HEMOGLOBIN (HGB A1C)      Component Value Range   Hemoglobin A1C 8.9     Assessment & Plan:   1. Type II or unspecified type diabetes mellitus without mention of complication, uncontrolled  Lipid panel, POCT glycosylated hemoglobin (Hb A1C), metformin (FORTAMET) 1000 MG (OSM) 24 hr tablet, lisinopril (PRINIVIL,ZESTRIL) 5 MG tablet. Start asa 81 mg.  Recheck in 2 wks - if doing ok, rec increasing metformin to 2 tabs po with dinner and will need repeat bmp at that time since starting  acei.  Will need referral for DM eye exam as well at f/u and monofil. Pt adamently declines DM ed but gave rx for meter, strips, lancets which he states he will fill.  2. Need for prophylactic vaccination and inoculation against influenza  Flu vaccine greater than or equal to 3yo preservative free IM  3. DIVERTICULITIS, ACUTE - h/o Will increase fiber in diet  4. ALCOHOL USE    5. Elevated liver function tests  Comprehensive metabolic panel, Hepatitis C antibody, Hepatitis B surface antigen, Hepatitis B surface antibody. Likely from alcohol use.    6. Alcohol abuse  Pt does not perceive his use to be at a level that is it a problem for him, no desire to quit  7. Encounter for long-term (current) use of other medications    8. Allergic conjunctivitis of both eyes  cromolyn (OPTICROM) 4 % ophthalmic solution   F/u in 2 wks to see if we can increase metformin and recheck bmp.  Will need likely need to be started on a statin at some point but it would be nice if we could get his LFTs to baseline first. Need to discuss Sleep apnea diagnosis at f/u as well - seen on problem list, unsure if pt is on cpap

## 2012-05-19 LAB — HEPATITIS B SURFACE ANTIBODY, QUANTITATIVE: Hepatitis B-Post: 0.4 m[IU]/mL

## 2012-05-20 ENCOUNTER — Other Ambulatory Visit: Payer: Self-pay | Admitting: Family Medicine

## 2012-05-20 DIAGNOSIS — F1010 Alcohol abuse, uncomplicated: Secondary | ICD-10-CM

## 2012-05-22 ENCOUNTER — Ambulatory Visit
Admission: RE | Admit: 2012-05-22 | Discharge: 2012-05-22 | Disposition: A | Discharge status: Home / Self Care | Payer: BC Managed Care – PPO | Source: Ambulatory Visit | Attending: Family Medicine | Admitting: Family Medicine

## 2012-05-22 DIAGNOSIS — F1010 Alcohol abuse, uncomplicated: Secondary | ICD-10-CM

## 2012-06-01 ENCOUNTER — Ambulatory Visit (INDEPENDENT_AMBULATORY_CARE_PROVIDER_SITE_OTHER): Payer: BC Managed Care – PPO | Admitting: Family Medicine

## 2012-06-01 ENCOUNTER — Encounter: Payer: Self-pay | Admitting: Family Medicine

## 2012-06-01 VITALS — BP 122/82 | HR 85 | Temp 97.6°F | Resp 16 | Ht 67.0 in | Wt 163.6 lb

## 2012-06-01 DIAGNOSIS — K760 Fatty (change of) liver, not elsewhere classified: Secondary | ICD-10-CM | POA: Insufficient documentation

## 2012-06-01 DIAGNOSIS — Z79899 Other long term (current) drug therapy: Secondary | ICD-10-CM | POA: Insufficient documentation

## 2012-06-01 DIAGNOSIS — E119 Type 2 diabetes mellitus without complications: Secondary | ICD-10-CM

## 2012-06-01 LAB — COMPREHENSIVE METABOLIC PANEL
BUN: 15 mg/dL (ref 6–23)
CO2: 25 mEq/L (ref 19–32)
Calcium: 10 mg/dL (ref 8.4–10.5)
Chloride: 100 mEq/L (ref 96–112)
Creat: 0.87 mg/dL (ref 0.50–1.35)
Total Bilirubin: 0.8 mg/dL (ref 0.3–1.2)

## 2012-06-01 MED ORDER — METFORMIN HCL ER (OSM) 1000 MG PO TB24: 2000.0000 mg | ORAL_TABLET | Freq: Every day | ORAL | Status: DC

## 2012-06-01 NOTE — Progress Notes (Signed)
Subjective:    Patient ID: Todd [name], male    DOB: 08/26/1960, 52 y.o.   MRN: [id number]  HPI  Mr. Gregson is doing very well. He has been able to titrate his metformin xr up to 2000mg  with dinner. He has had no lows and has not been checking his cbgs.    He is working hard on his diet and exercise. Trying to reduce fat and cholesterol and has stopped drinking alcohol for the past 5d. Did have a few tylenol and plans to restart occ alcohol - maybe some light beer w/ football games or occ red wine.  He would really like to minimize medication. He would like to control his cholesterol through tlc and hopefully get his DM well controlled enough that he could wean down on that med as well.  Past Medical History  Diagnosis Date  . Diabetes mellitus without complication    Current Outpatient Prescriptions on File Prior to Visit  Medication Sig Dispense Refill  . cromolyn (OPTICROM) 4 % ophthalmic solution Place 1 drop into both eyes 4 (four) times daily.  10 mL  1  . lisinopril (PRINIVIL,ZESTRIL) 5 MG tablet Take 1 tablet (5 mg total) by mouth daily.  30 tablet  0  . tadalafil (CIALIS) 20 MG tablet Take 20 mg by mouth daily as needed.      . testosterone (ANDROGEL) 50 MG/5GM GEL Place 5 g onto the skin daily.       No Known Allergies Family History  Problem Relation Age of Onset  . Diabetes Father   . Diabetes Brother      Review of Systems  Constitutional: Negative for fever, chills, activity change and appetite change.  HENT: Positive for dental problem.   Eyes: Negative for visual disturbance.  Respiratory: Negative for shortness of breath.   Cardiovascular: Negative for chest pain and leg swelling.  Gastrointestinal: Negative for nausea, vomiting, abdominal pain, diarrhea, constipation and abdominal distention.  Neurological: Negative for weakness and numbness.  Psychiatric/Behavioral: Negative for dysphoric mood. The patient is not nervous/anxious.       BP 122/82  Pulse 85   Temp 97.6 F (36.4 C) (Oral)  Resp 16  Ht 5\' 7"  (1.702 m)  Wt 163 lb 9.6 oz (74.208 kg)  BMI 25.62 kg/m2  SpO2 99% Objective:   Physical Exam  Constitutional: He is oriented to person, place, and time. He appears well-developed and well-nourished. No distress.  HENT:  Head: Normocephalic and atraumatic.  Eyes: No scleral icterus.  Pulmonary/Chest: Effort normal.  Neurological: He is alert and oriented to person, place, and time.  Skin: Skin is warm and dry. He is not diaphoretic.  Psychiatric: He has a normal mood and affect. His behavior is normal.      Assessment & Plan:  1. DM - cont metformin xr 2000mg  qd (w/ dinner). Recheck hgba1c at f/u.  At f/u - needs monofil, discuss asa 66, cons optho eval.  Also cons rechecking urine microalb at f/u - last was nml in 10/2011 - I started pt on low dose lisinopril anyway for kidney protection at last visit so will recheck renal function today but as long as it is ok, will stop lisinopril and just cont to monitor urine microalb - only start if elevated.   2. HPL - goal LDL <100 since pt has DM but would be at goal if pt did not have DM. He wants to work on diet and exercise and try to get rid of DM  before starting on a cholesterol medication - esp w/ his liver irritation. 3. Elev LFTs - hepatic steatosis seen on [place] so pt trying low fat diet and decreasing alcohol.  Asymptomatic.

## 2012-08-31 ENCOUNTER — Ambulatory Visit: Payer: BC Managed Care – PPO | Admitting: Family Medicine

## 2012-09-14 ENCOUNTER — Encounter: Payer: Self-pay | Admitting: Family Medicine

## 2012-09-14 ENCOUNTER — Ambulatory Visit (INDEPENDENT_AMBULATORY_CARE_PROVIDER_SITE_OTHER): Payer: BC Managed Care – PPO | Admitting: Family Medicine

## 2012-09-14 VITALS — BP 120/88 | HR 83 | Temp 97.7°F | Resp 16 | Ht 67.0 in | Wt 167.8 lb

## 2012-09-14 DIAGNOSIS — K219 Gastro-esophageal reflux disease without esophagitis: Secondary | ICD-10-CM

## 2012-09-14 DIAGNOSIS — Z79899 Other long term (current) drug therapy: Secondary | ICD-10-CM

## 2012-09-14 DIAGNOSIS — G4730 Sleep apnea, unspecified: Secondary | ICD-10-CM

## 2012-09-14 DIAGNOSIS — R7989 Other specified abnormal findings of blood chemistry: Secondary | ICD-10-CM

## 2012-09-14 LAB — POCT GLYCOSYLATED HEMOGLOBIN (HGB A1C): Hemoglobin A1C: 7.9

## 2012-09-14 MED ORDER — METFORMIN HCL ER (OSM) 1000 MG PO TB24: 2000.0000 mg | ORAL_TABLET | Freq: Every day | ORAL | Status: DC

## 2012-09-14 MED ORDER — RABEPRAZOLE SODIUM 20 MG PO TBEC: 20.0000 mg | DELAYED_RELEASE_TABLET | Freq: Every day | ORAL | Status: DC

## 2012-09-14 NOTE — Patient Instructions (Addendum)
Diabetes Meal Planning Guide The diabetes meal planning guide is a tool to help you plan your meals and snacks. It is important for people with diabetes to manage their blood glucose (sugar) levels. Choosing the right foods and the right amounts throughout your day will help control your blood glucose. Eating right can even help you improve your blood pressure and reach or maintain a healthy weight. CARBOHYDRATE COUNTING MADE EASY When you eat carbohydrates, they turn to sugar. This raises your blood glucose level. Counting carbohydrates can help you control this level so you feel better. When you plan your meals by counting carbohydrates, you can have more flexibility in what you eat and balance your medicine with your food intake. Carbohydrate counting simply means adding up the total amount of carbohydrate grams in your meals and snacks. Try to eat about the same amount at each meal. Foods with carbohydrates are listed below. Each portion below is 1 carbohydrate serving or 15 grams of carbohydrates. Ask your dietician how many grams of carbohydrates you should eat at each meal or snack. Grains and Starches  1 slice bread.   English muffin or hotdog/hamburger bun.   cup cold cereal (unsweetened).   cup cooked pasta or rice.   cup starchy vegetables (corn, potatoes, peas, beans, winter squash).  1 tortilla (6 inches).   bagel.  1 waffle or pancake (size of a CD).   cup cooked cereal.  4 to 6 small crackers. *Whole grain is recommended. Fruit  1 cup fresh unsweetened berries, melon, papaya, pineapple.  1 small fresh fruit.   banana or mango.   cup fruit juice (4 oz unsweetened).   cup canned fruit in natural juice or water.  2 tbs dried fruit.  12 to 15 grapes or cherries. Milk and Yogurt  1 cup fat-free or 1% milk.  1 cup soy milk.  6 oz light yogurt with sugar-free sweetener.  6 oz low-fat soy yogurt.  6 oz plain yogurt. Vegetables  1 cup raw or  cup  cooked is counted as 0 carbohydrates or a "free" food.  If you eat 3 or more servings at 1 meal, count them as 1 carbohydrate serving. Other Carbohydrates   oz chips or pretzels.   cup ice cream or frozen yogurt.   cup sherbet or sorbet.  2 inch square cake, no frosting.  1 tbs honey, sugar, jam, jelly, or syrup.  2 small cookies.  3 squares of graham crackers.  3 cups popcorn.  6 crackers.  1 cup broth-based soup.  Count 1 cup casserole or other mixed foods as 2 carbohydrate servings.  Foods with less than 20 calories in a serving may be counted as 0 carbohydrates or a "free" food. You may want to purchase a book or computer software that lists the carbohydrate gram counts of different foods. In addition, the nutrition facts panel on the labels of the foods you eat are a good source of this information. The label will tell you how big the serving size is and the total number of carbohydrate grams you will be eating per serving. Divide this number by 15 to obtain the number of carbohydrate servings in a portion. Remember, 1 carbohydrate serving equals 15 grams of carbohydrate. SERVING SIZES Measuring foods and serving sizes helps you make sure you are getting the right amount of food. The list below tells how big or small some common serving sizes are.  1 oz.........4 stacked dice.  3 oz.........Deck of cards.  1 tsp........Tip   of little finger.  1 tbs......Marland KitchenMarland KitchenThumb.  2 tbs.......Marland KitchenGolf ball.   cup......Marland KitchenHalf of a fist.  1 cup.......Marland KitchenA fist. SAMPLE DIABETES MEAL PLAN Below is a sample meal plan that includes foods from the grain and starches, dairy, vegetable, fruit, and meat groups. A dietician can individualize a meal plan to fit your calorie needs and tell you the number of servings needed from each food group. However, controlling the total amount of carbohydrates in your meal or snack is more important than making sure you include all of the food groups at every  meal. You may interchange carbohydrate containing foods (dairy, starches, and fruits). The meal plan below is an example of a 2000 calorie diet using carbohydrate counting. This meal plan has 17 carbohydrate servings. Breakfast  1 cup oatmeal (2 carb servings).   cup light yogurt (1 carb serving).  1 cup blueberries (1 carb serving).   cup almonds. Snack  1 large apple (2 carb servings).  1 low-fat string cheese stick. Lunch  Chicken breast salad.  1 cup spinach.   cup chopped tomatoes.  2 oz chicken breast, sliced.  2 tbs low-fat [place] dressing.  12 whole-wheat crackers (2 carb servings).  12 to 15 grapes (1 carb serving).  1 cup low-fat milk (1 carb serving). Snack  1 cup carrots.   cup hummus (1 carb serving). Dinner  3 oz broiled salmon.  1 cup brown rice (3 carb servings). Snack  1  cups steamed broccoli (1 carb serving) drizzled with 1 tsp olive oil and lemon juice.  1 cup light pudding (2 carb servings). DIABETES MEAL PLANNING WORKSHEET Your dietician can use this worksheet to help you decide how many servings of foods and what types of foods are right for you.  BREAKFAST Food Group and Servings / Carb Servings Grain/Starches __________________________________ Dairy __________________________________________ Vegetable ______________________________________ Fruit ___________________________________________ Meat __________________________________________ Fat ____________________________________________ LUNCH Food Group and Servings / Carb Servings Grain/Starches ___________________________________ Dairy ___________________________________________ Fruit ____________________________________________ Meat ___________________________________________ Fat _____________________________________________ Laural Golden Food Group and Servings / Carb Servings Grain/Starches ___________________________________ Dairy  ___________________________________________ Fruit ____________________________________________ Meat ___________________________________________ Fat _____________________________________________ SNACKS Food Group and Servings / Carb Servings Grain/Starches ___________________________________ Dairy ___________________________________________ Vegetable _______________________________________ Fruit ____________________________________________ Meat ___________________________________________ Fat _____________________________________________ DAILY TOTALS Starches _________________________ Vegetable ________________________ Fruit ____________________________ Dairy ____________________________ Meat ____________________________ Fat ______________________________ Document Released: 02/03/2005 Document Revised: 08/01/2011 Document Reviewed: 12/15/2008 [employer] Patient Information 2013 [place], [place]. Diabetes, Eating Away From Home Sometimes, you might eat in a restaurant or have meals that are prepared by someone else. You can enjoy eating out. However, the portions in restaurants may be much larger than needed. Listed below are some ideas to help you choose foods that will keep your blood glucose (sugar) in better control.  TIPS FOR EATING OUT  Know your meal plan and how many carbohydrate servings you should have at each meal. You may wish to carry a copy of your meal plan in your purse or wallet. Learn the foods included in each food group.  Make a list of restaurants near you that offer healthy choices. Take a copy of the carry-out menus to see what they offer. Then, you can plan what you will order ahead of time.  Become familiar with serving sizes by practicing them at home using measuring cups and spoons. Once you learn to recognize portion sizes, you will be able to correctly estimate the amount of total carbohydrate you are allowed to eat at the restaurant. Ask for a takeout box if the  portion is more than you  should have. When your food comes, leave the amount you should have on the plate, and put the rest in the takeout box before you start eating.  Plan ahead if your mealtime will be different from usual. Check with your caregiver to find out how to time meals and medicine if you are taking insulin.  Avoid high-fat foods, such as fried foods, cream sauces, high-fat salad dressings, or any added butter or margarine.  Do not be afraid to ask questions. Ask your server about the portion size, cooking methods, ingredients and if items can be substituted. Restaurants do not list all available items on the menu. You can ask for your main entree to be prepared using skim milk, oil instead of butter or margarine, and without gravy or sauces. Ask your waiter or waitress to serve salad dressings, gravy, sauces, margarine, and sour cream on the side. You can then add the amount your meal plan suggests.  Add more vegetables whenever possible.  Avoid items that are labeled "jumbo," "giant," "deluxe," or "supersized."  You may want to split an entre with someone and order an extra side salad.  Watch for hidden calories in foods like croutons, bacon, or cheese.  Ask your server to take away the bread basket or chips from your table.  Order a dinner salad as an appetizer. You can eat most foods served in a restaurant. Some foods are better choices than others. Breads and Starches  Recommended: All kinds of bread (wheat, rye, white, oatmeal, [place], [place], raisin), hard or soft dinner rolls, frankfurter or hamburger buns, small bagels, small corn or whole-wheat flour tortillas.  Avoid: Frosted or glazed breads, butter rolls, egg or cheese breads, croissants, sweet rolls, pastries, coffee cake, glazed or frosted doughnuts, muffins. Crackers  Recommended: Animal crackers, graham, rye, saltine, oyster, and matzoth crackers. Bread sticks, melba toast, rusks, pretzels, popcorn (without  fat), zwieback toast.  Avoid: High-fat snack crackers or chips. Buttered popcorn. Cereals  Recommended: Hot and cold cereals. Whole grains such as oatmeal or shredded wheat are good choices.  Avoid: Sugar-coated or granola type cereals. Potatoes/Pasta/Rice/Beans  Recommended: Order baked, boiled, or mashed potatoes, rice or noodles without added fat, whole beans. Order gravies, butter, margarine, or sauces on the side so you can control the amount you add.  Avoid: Hash browns or fried potatoes. Potatoes, pasta, or rice prepared with cream or cheese sauce. Potato or pasta salads prepared with large amounts of dressing. Fried beans or fried rice. Vegetables  Recommended: Order steamed, baked, boiled, or stewed vegetables without sauces or extra fat. Ask that sauce be served on the side. If vegetables are not listed on the menu, ask what is available.  Avoid: Vegetables prepared with cream, butter, or cheese sauce. Fried vegetables. Salad Bars  Recommended: Many of the vegetables at a salad bar are considered "free." Use lemon juice, vinegar, or low-calorie salad dressing (fewer than 20 calories per serving) as "free" dressings for your salad. Look for salad bar ingredients that have no added fat or sugar such as tomatoes, lettuce, cucumbers, broccoli, carrots, onions, and mushrooms.  Avoid: Prepared salads with large amounts of dressing, such as coleslaw, caesar salad, macaroni salad, bean salad, or carrot salad. Fruit  Recommended: Eat fresh fruit or fresh fruit salad without added dressing. A salad bar often offers fresh fruit choices, but canned fruit at a restaurant is usually packed in sugar or syrup.  Avoid: Sweetened canned or frozen fruits, plain or sweetened fruit juice. Fruit salads with dressing,  sour cream, or sugar added to them. Meat and Meat Substitutes  Recommended: Order broiled, baked, roasted, or grilled meat, poultry, or fish. Trim off all visible fat. Do not eat the  skin of poultry. The size stated on the menu is the raw weight. Meat shrinks by  in cooking (for example, 4 oz raw equals 3 oz cooked meat).  Avoid: Deep-fat fried meat, poultry, or fish. Breaded meats. Eggs  Recommended: Order soft, hard-cooked, poached, or scrambled eggs. Omelets may be okay, depending on what ingredients are added. Egg substitutes are also a good choice.  Avoid: Fried eggs, eggs prepared with cream or cheese sauce. Milk  Recommended: Order low-fat or fat-free milk according to your meal plan. Plain, nonfat yogurt or flavored yogurt with no sugar added may be used as a substitute for milk. Soy milk may also be used.  Avoid: Milk shakes or sweetened milk beverages. Soups and Combination Foods  Recommended: Clear broth or consomm are "free" foods and may be used as an appetizer. Broth-based soups with fat removed count as a starch serving and are preferred over cream soups. Soups made with beans or split peas may be eaten but count as a starch.  Avoid: Fatty soups, soup made with cream, cheese soup. Combination foods prepared with excessive amounts of fat or with cream or cheese sauces. Desserts and Sweets  Recommended: Ask for fresh fruit. Sponge or angel food cake without icing, ice milk, no sugar added ice cream, sherbet, or frozen yogurt may fit into your meal plan occasionally.  Avoid: Pastries, puddings, pies, cakes with icing, custard, gelatin desserts. Fats and Oils  Recommended: Choose healthy fats such as olive oil, canola oil, or tub margarine, reduced fat or fat-free sour cream, cream cheese, avocado, or nuts.  Avoid: Any fats in excess of your allowed portion. Deep-fried foods or any food with a large amount of fat. Note: Ask for all fats to be served on the side, and limit your portion sizes according to your meal plan. Document Released: 05/09/2005 Document Revised: 08/01/2011 Document Reviewed: 11/27/2008 [facility] Patient Information 2013 [place],  [place]. Fat and Cholesterol Control Diet Cholesterol levels in your body are determined significantly by your diet. Cholesterol levels may also be related to heart disease. The following material helps to explain this relationship and discusses what you can do to help keep your heart healthy. Not all cholesterol is bad. Low-density lipoprotein (LDL) cholesterol is the "bad" cholesterol. It may cause fatty deposits to build up inside your arteries. High-density lipoprotein (HDL) cholesterol is "good." It helps to remove the "bad" LDL cholesterol from your blood. Cholesterol is a very important risk factor for heart disease. Other risk factors are high blood pressure, smoking, stress, heredity, and weight. The heart muscle gets its supply of blood through the coronary arteries. If your LDL cholesterol is high and your HDL cholesterol is low, you are at risk for having fatty deposits build up in your coronary arteries. This leaves less room through which blood can flow. Without sufficient blood and oxygen, the heart muscle cannot function properly and you may feel chest pains (angina pectoris). When a coronary artery closes up entirely, a part of the heart muscle may die causing a heart attack (myocardial infarction). CHECKING CHOLESTEROL When your caregiver sends your blood to a lab to be examined for cholesterol, a complete lipid (fat) profile may be done. With this test, the total amount of cholesterol and levels of LDL and HDL are determined. Triglycerides are a type  of fat that circulates in the blood. They can also be used to determine heart disease risk. The list below describes what the numbers should be: Test: Total Cholesterol.  Less than 200 mg/dl. Test: LDL "bad cholesterol."  Less than 100 mg/dl.  Less than 70 mg/dl if you are at very high risk of a heart attack or sudden cardiac death. Test: HDL "good cholesterol."  Greater than 50 mg/dl for women.  Greater than 40 mg/dl for men. Test:  Triglycerides.  Less than 150 mg/dl. CONTROLLING CHOLESTEROL WITH DIET Although exercise and lifestyle factors are important, your diet is key. That is because certain foods are known to raise cholesterol and others to lower it. The goal is to balance foods for their effect on cholesterol and more importantly, to replace saturated and trans fat with other types of fat, such as monounsaturated fat, polyunsaturated fat, and omega-3 fatty acids. On average, a person should consume no more than 15 to 17 g of saturated fat daily. Saturated and trans fats are considered "bad" fats, and they will raise LDL cholesterol. Saturated fats are primarily found in animal products such as meats, butter, and cream. However, that does not mean you need to give up all your favorite foods. Today, there are good tasting, low-fat, low-cholesterol substitutes for most of the things you like to eat. Choose low-fat or nonfat alternatives. Choose round or loin cuts of red meat. These types of cuts are lowest in fat and cholesterol. Chicken (without the skin), fish, veal, and ground [place] breast are great choices. Eliminate fatty meats, such as hot dogs and salami. Even shellfish have little or no saturated fat. Have a 3 oz (85 g) portion when you eat lean meat, poultry, or fish. Trans fats are also called "partially hydrogenated oils." They are oils that have been scientifically manipulated so that they are solid at room temperature resulting in a longer shelf life and improved taste and texture of foods in which they are added. Trans fats are found in stick margarine, some tub margarines, cookies, crackers, and baked goods.  When baking and cooking, oils are a great substitute for butter. The monounsaturated oils are especially beneficial since it is believed they lower LDL and raise HDL. The oils you should avoid entirely are saturated tropical oils, such as coconut and palm.  Remember to eat a lot from food groups that are  naturally free of saturated and trans fat, including fish, fruit, vegetables, beans, grains (barley, rice, couscous, bulgur wheat), and pasta (without cream sauces).  IDENTIFYING FOODS THAT LOWER CHOLESTEROL  Soluble fiber may lower your cholesterol. This type of fiber is found in fruits such as apples, vegetables such as broccoli, potatoes, and carrots, legumes such as beans, peas, and lentils, and grains such as barley. Foods fortified with plant sterols (phytosterol) may also lower cholesterol. You should eat at least 2 g per day of these foods for a cholesterol lowering effect.  Read package labels to identify low-saturated fats, trans fat free, and low-fat foods at the supermarket. Select cheeses that have only 2 to 3 g saturated fat per ounce. Use a heart-healthy tub margarine that is free of trans fats or partially hydrogenated oil. When buying baked goods (cookies, crackers), avoid partially hydrogenated oils. Breads and muffins should be made from whole grains (whole-wheat or whole oat flour, instead of "flour" or "enriched flour"). Buy non-creamy canned soups with reduced salt and no added fats.  FOOD PREPARATION TECHNIQUES  Never deep-fry. If you must fry,  either stir-fry, which uses very little fat, or use non-stick cooking sprays. When possible, broil, bake, or roast meats, and steam vegetables. Instead of putting butter or margarine on vegetables, use lemon and herbs, applesauce, and cinnamon (for squash and sweet potatoes), nonfat yogurt, salsa, and low-fat dressings for salads.  LOW-SATURATED FAT / LOW-FAT FOOD SUBSTITUTES Meats / Saturated Fat (g)  Avoid: Steak, marbled (3 oz/85 g) / 11 g  Choose: Steak, lean (3 oz/85 g) / 4 g  Avoid: Hamburger (3 oz/85 g) / 7 g  Choose: Hamburger, lean (3 oz/85 g) / 5 g  Avoid: Ham (3 oz/85 g) / 6 g  Choose: Ham, lean cut (3 oz/85 g) / 2.4 g  Avoid: Chicken, with skin, dark meat (3 oz/85 g) / 4 g  Choose: Chicken, skin removed, dark meat (3  oz/85 g) / 2 g  Avoid: Chicken, with skin, light meat (3 oz/85 g) / 2.5 g  Choose: Chicken, skin removed, light meat (3 oz/85 g) / 1 g Dairy / Saturated Fat (g)  Avoid: Whole milk (1 cup) / 5 g  Choose: Low-fat milk, 2% (1 cup) / 3 g  Choose: Low-fat milk, 1% (1 cup) / 1.5 g  Choose: Skim milk (1 cup) / 0.3 g  Avoid: Hard cheese (1 oz/28 g) / 6 g  Choose: Skim milk cheese (1 oz/28 g) / 2 to 3 g  Avoid: Cottage cheese, 4% fat (1 cup) / 6.5 g  Choose: Low-fat cottage cheese, 1% fat (1 cup) / 1.5 g  Avoid: Ice cream (1 cup) / 9 g  Choose: Sherbet (1 cup) / 2.5 g  Choose: Nonfat frozen yogurt (1 cup) / 0.3 g  Choose: Frozen fruit bar / trace  Avoid: Whipped cream (1 tbs) / 3.5 g  Choose: Nondairy whipped topping (1 tbs) / 1 g Condiments / Saturated Fat (g)  Avoid: Mayonnaise (1 tbs) / 2 g  Choose: Low-fat mayonnaise (1 tbs) / 1 g  Avoid: Butter (1 tbs) / 7 g  Choose: Extra light margarine (1 tbs) / 1 g  Avoid: Coconut oil (1 tbs) / 11.8 g  Choose: Olive oil (1 tbs) / 1.8 g  Choose: Corn oil (1 tbs) / 1.7 g  Choose: Safflower oil (1 tbs) / 1.2 g  Choose: Sunflower oil (1 tbs) / 1.4 g  Choose: Soybean oil (1 tbs) / 2.4 g  Choose: Canola oil (1 tbs) / 1 g Document Released: 05/09/2005 Document Revised: 08/01/2011 Document Reviewed: 10/28/2010 [facility] Patient Information 2013 [place], [place].

## 2012-09-14 NOTE — Progress Notes (Signed)
Subjective:    Patient ID: Todd [name], male    DOB: June 12, 1960, 52 y.o.   MRN: [id number] Chief Complaint  Patient presents with  . Diabetes  . Medication Refill   HPI  Severn has been doing really well. He went 35 days without drinking alcohol at all. Since then, he has resumed drinking a little.   He is taking his metformin 2 tabs with dinner and it is hard on his stomach but he has been complying. He knows that he is gaining weight over the winter but plans to work harder on his diet and get more exercise now that it is nice out. He does have OSA but is told due to large adenoids and tonsils so not on CPAP - thinks he might get surgery for it this yr if he can get the time off. Gets his testim and cialis from his urologist. Used to have very severe gerd when he was drinking alcohol heavily.  Was only controlled by aciphex - other generic things didn't work.  In the past mo, he has had some recurrence of heartburn and indigestion and would like to retry it.  Past Medical History  Diagnosis Date  . Diabetes mellitus without complication    Current Outpatient Prescriptions on File Prior to Visit  Medication Sig Dispense Refill  . tadalafil (CIALIS) 20 MG tablet Take 20 mg by mouth daily as needed.      . testosterone (ANDROGEL) 50 MG/5GM GEL Place 5 g onto the skin daily.      . cromolyn (OPTICROM) 4 % ophthalmic solution Place 1 drop into both eyes 4 (four) times daily.  10 mL  1   No current facility-administered medications on file prior to visit.   No Known Allergies  Review of Systems  Constitutional: Negative for fever and chills.  Eyes: Negative for visual disturbance.  Respiratory: Negative for shortness of breath.   Cardiovascular: Negative for chest pain and leg swelling.  Gastrointestinal: Positive for abdominal pain.  Neurological: Negative for dizziness, syncope, facial asymmetry, weakness, light-headedness and headaches.      BP 120/88  Pulse 83  Temp(Src) 97.7  F (36.5 C) (Oral)  Resp 16  Ht 5\' 7"  (1.702 m)  Wt 167 lb 12.8 oz (76.114 kg)  BMI 26.28 kg/m2  SpO2 97% Objective:   Physical Exam  Constitutional: He is oriented to person, place, and time. He appears well-developed and well-nourished. No distress.  HENT:  Head: Normocephalic and atraumatic.  Right Ear: Tympanic membrane is injected.  Left Ear: Tympanic membrane is retracted.  Nose: Mucosal edema, rhinorrhea and septal deviation present.  Mouth/Throat: Uvula is midline and mucous membranes are normal. Posterior oropharyngeal erythema present. No oropharyngeal exudate or posterior oropharyngeal edema.  Eyes: Conjunctivae are normal. Pupils are equal, round, and reactive to light. No scleral icterus.  Neck: Normal range of motion. Neck supple. No thyromegaly present.  Cardiovascular: Normal rate, regular rhythm, normal heart sounds and intact distal pulses.   Pulmonary/Chest: Effort normal and breath sounds normal. No respiratory distress.  Musculoskeletal: He exhibits no edema.  Lymphadenopathy:    He has no cervical adenopathy.  Neurological: He is alert and oriented to person, place, and time.  Skin: Skin is warm and dry. He is not diaphoretic.  Psychiatric: He has a normal mood and affect. His behavior is normal.      Results for orders placed in visit on 09/14/12  POCT GLYCOSYLATED HEMOGLOBIN (HGB A1C)      Result Value  Range   Hemoglobin A1C 7.9     Assessment & Plan:  Type II or unspecified type diabetes mellitus without mention of complication, uncontrolled - Plan: POCT glycosylated hemoglobin (Hb A1C), Comprehensive metabolic panel, metformin (FORTAMET) 1000 MG (OSM) 24 hr tablet.. hgba1c substantially improved from 8.9->7.9 in last 3 mos on 2000 xr metformin.  He will continue this as well as working on diet and exercise for goal a1c <7. Really wants to minimize meds and get off of all meds. Recheck in 4 mos.  SLEEP APNEA - cons ENT eval for surgery  Encounter for  long-term (current) use of other medications - Plan: Comprehensive metabolic panel  Elevated liver function tests - recheck, cont to minimize alcohol and fat  GERD (gastroesophageal reflux disease) - retry ppi.  HPL - declines statin. Cont to monitor and ok to hold off as long as DM is improving and working on tlc. Recheck at f/u.  Meds ordered this encounter  Medications  . metformin (FORTAMET) 1000 MG (OSM) 24 hr tablet    Sig: Take 2 tablets (2,000 mg total) by mouth daily with supper.    Dispense:  180 tablet    Refill:  2  . RABEprazole (ACIPHEX) 20 MG tablet    Sig: Take 1 tablet (20 mg total) by mouth daily.    Dispense:  30 tablet    Refill:  3

## 2012-09-15 LAB — COMPREHENSIVE METABOLIC PANEL
ALT: 71 U/L — ABNORMAL HIGH (ref 0–53)
AST: 51 U/L — ABNORMAL HIGH (ref 0–37)
Albumin: 4.4 g/dL (ref 3.5–5.2)
CO2: 23 mEq/L (ref 19–32)
Calcium: 9.7 mg/dL (ref 8.4–10.5)
Chloride: 102 mEq/L (ref 96–112)
Potassium: 4.1 mEq/L (ref 3.5–5.3)
Sodium: 136 mEq/L (ref 135–145)
Total Protein: 7.1 g/dL (ref 6.0–8.3)

## 2013-01-18 ENCOUNTER — Ambulatory Visit (INDEPENDENT_AMBULATORY_CARE_PROVIDER_SITE_OTHER): Payer: BC Managed Care – PPO | Admitting: Family Medicine

## 2013-01-18 ENCOUNTER — Encounter: Payer: Self-pay | Admitting: Family Medicine

## 2013-01-18 VITALS — BP 110/90 | HR 86 | Temp 97.9°F | Resp 16 | Ht 67.5 in | Wt 164.2 lb

## 2013-01-18 DIAGNOSIS — Z79899 Other long term (current) drug therapy: Secondary | ICD-10-CM

## 2013-01-18 DIAGNOSIS — K760 Fatty (change of) liver, not elsewhere classified: Secondary | ICD-10-CM

## 2013-01-18 DIAGNOSIS — E119 Type 2 diabetes mellitus without complications: Secondary | ICD-10-CM

## 2013-01-18 DIAGNOSIS — E785 Hyperlipidemia, unspecified: Secondary | ICD-10-CM

## 2013-01-18 DIAGNOSIS — E291 Testicular hypofunction: Secondary | ICD-10-CM

## 2013-01-18 LAB — COMPREHENSIVE METABOLIC PANEL
AST: 49 U/L — ABNORMAL HIGH (ref 0–37)
BUN: 15 mg/dL (ref 6–23)
Calcium: 9.6 mg/dL (ref 8.4–10.5)
Chloride: 101 mEq/L (ref 96–112)
Creat: 1.06 mg/dL (ref 0.50–1.35)
Total Bilirubin: 1.6 mg/dL — ABNORMAL HIGH (ref 0.3–1.2)

## 2013-01-18 MED ORDER — SITAGLIPTIN PHOS-METFORMIN HCL 50-1000 MG PO TABS: 1.0000 | ORAL_TABLET | Freq: Two times a day (BID) | ORAL | Status: DC

## 2013-01-18 NOTE — Progress Notes (Signed)
Subjective:    Patient ID: Todd [name], male    DOB: 1961-04-23, 52 y.o.   MRN: [id number] Chief Complaint  Patient presents with  . Diabetes  . 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 Had his DOT exam on 6/15 at another clinic.  Taking long acting metformin 2 tabs at night was getting hard on his stomach so is taking ER metformin bid - occ forgets to take it entirely or will forget 1 dose. Reports whenever he sees his urologist Dr. Annabell Howells they tell him there is sugar in his urine but at his DOT exam his dip was fine. Drinking in spurts.  During the golf championship he went to a few parties, spent some time at the beach during which he drinks a lot more. Not getting any reg exercise other than playing with his kids.  Declines TDaP today. Colonoscopy was 2006 - nml per pt  Past Medical History  Diagnosis Date  . Diabetes mellitus without complication    Current Outpatient Prescriptions on File Prior to Visit  Medication Sig Dispense Refill  . tadalafil (CIALIS) 20 MG tablet Take 20 mg by mouth daily as needed.      . testosterone (ANDROGEL) 50 MG/5GM GEL Place 5 g onto the skin daily.       No current facility-administered medications on file prior to visit.   No Known Allergies   Review of Systems  Constitutional: Negative for fever and chills.  Eyes: Negative for visual disturbance.  Respiratory: Negative for shortness of breath.   Cardiovascular: Negative for chest pain and leg swelling.  Neurological: Negative for dizziness, syncope, facial asymmetry, weakness, light-headedness and headaches.      BP 110/90  Pulse 86  Temp(Src) 97.9 F (36.6 C) (Oral)  Resp 16  Ht 5' 7.5" (1.715 m)  Wt 164 lb 3.2 oz (74.481 kg)  BMI 25.32 kg/m2  SpO2 100% Objective:   Physical Exam  Constitutional: He is oriented to person, place, and time. He appears well-developed and well-nourished. No distress.  HENT:  Head: Normocephalic and atraumatic.  Eyes:  Conjunctivae are normal. Pupils are equal, round, and reactive to light. No scleral icterus.  Neck: Normal range of motion. Neck supple. No thyromegaly present.  Cardiovascular: Normal rate, regular rhythm, normal heart sounds and intact distal pulses.   Pulmonary/Chest: Effort normal and breath sounds normal. No respiratory distress.  Musculoskeletal: He exhibits no edema.  Lymphadenopathy:    He has no cervical adenopathy.  Neurological: He is alert and oriented to person, place, and time.  Skin: Skin is warm and dry. He is not diaphoretic.  Psychiatric: He has a normal mood and affect. His behavior is normal.      Results for orders placed in visit on 01/18/13  POCT GLYCOSYLATED HEMOGLOBIN (HGB A1C)      Result Value Range   Hemoglobin A1C 8.1      Assessment & Plan:  Type II or unspecified type diabetes mellitus without mention of complication, not stated as uncontrolled - Plan: POCT glycosylated hemoglobin (Hb A1C), Comprehensive metabolic panel, - a1c cont to be mildly elevated on metformin alone so will try switching to janumet -  Has had trouble tolerating metformin in the past but currently doing ok - however, if more nausea/diarrhea may need to decrease to 50/500 Janumet.  Needs microalb at f/u, start asa 81mg  (pt reluctant to take more pills.)  Optho exam?  Hypogonadism male - followed by Dr. Annabell Howells  Encounter  for long-term (current) use of other medications -   Elevated liver function tests due to hepatic steatosis and alcohol abuse - recheck.  HPL - lipids elev in past but reluctant to use statin due to elev LFTs - recheck flp at f/u  HM - pull paper chart at f/u to confirm prior pneumovax and colonoscopy around 2006, likely needs TDaP.

## 2013-01-18 NOTE — Patient Instructions (Signed)
Come see me in early January and we will check your FASTING LABS and urine (no food for 8 hours before your visit.

## 2013-01-19 ENCOUNTER — Encounter: Payer: Self-pay | Admitting: Family Medicine

## 2013-02-14 ENCOUNTER — Telehealth: Payer: Self-pay

## 2013-02-14 NOTE — Telephone Encounter (Signed)
PT STATES HE CANNOT AFFORD THE NEW DIABETES MEDS THAT WERE PRESCRIBED,HE WANTS TO GO BACK TO AN OLDER GENERIC BRAND THAT HE CAN AFFORD.   BEST PHONE [phone]  [facility] [address]

## 2013-02-18 MED ORDER — GLIPIZIDE ER 5 MG PO TB24: 5.0000 mg | ORAL_TABLET | Freq: Every day | ORAL | Status: DC

## 2013-02-18 MED ORDER — METFORMIN HCL ER (OSM) 1000 MG PO TB24: 1000.0000 mg | ORAL_TABLET | Freq: Two times a day (BID) | ORAL | Status: DC

## 2013-02-18 NOTE — Telephone Encounter (Signed)
Janumet discontinued due to cost so try metformin ER that he was on previously and add in glucotrol XL 5 qd.  Recheck in 3 months - beginning of January.

## 2013-02-19 NOTE — Telephone Encounter (Signed)
Patient advised, he has appt Jan 10th

## 2013-04-03 ENCOUNTER — Encounter: Payer: Self-pay | Admitting: Family Medicine

## 2013-04-03 DIAGNOSIS — E291 Testicular hypofunction: Secondary | ICD-10-CM

## 2013-05-31 ENCOUNTER — Ambulatory Visit (INDEPENDENT_AMBULATORY_CARE_PROVIDER_SITE_OTHER): Payer: BC Managed Care – PPO | Admitting: Family Medicine

## 2013-05-31 ENCOUNTER — Encounter: Payer: Self-pay | Admitting: Family Medicine

## 2013-05-31 VITALS — BP 124/84 | HR 76 | Temp 97.6°F | Resp 16 | Ht 67.8 in | Wt 168.8 lb

## 2013-05-31 DIAGNOSIS — K760 Fatty (change of) liver, not elsewhere classified: Secondary | ICD-10-CM

## 2013-05-31 DIAGNOSIS — H6120 Impacted cerumen, unspecified ear: Secondary | ICD-10-CM

## 2013-05-31 DIAGNOSIS — K7689 Other specified diseases of liver: Secondary | ICD-10-CM

## 2013-05-31 DIAGNOSIS — IMO0001 Reserved for inherently not codable concepts without codable children: Secondary | ICD-10-CM

## 2013-05-31 DIAGNOSIS — R945 Abnormal results of liver function studies: Secondary | ICD-10-CM

## 2013-05-31 DIAGNOSIS — R7989 Other specified abnormal findings of blood chemistry: Secondary | ICD-10-CM

## 2013-05-31 DIAGNOSIS — H6122 Impacted cerumen, left ear: Secondary | ICD-10-CM

## 2013-05-31 DIAGNOSIS — Z79899 Other long term (current) drug therapy: Secondary | ICD-10-CM

## 2013-05-31 DIAGNOSIS — E1165 Type 2 diabetes mellitus with hyperglycemia: Principal | ICD-10-CM

## 2013-05-31 LAB — COMPREHENSIVE METABOLIC PANEL
ALT: 66 U/L — AB (ref 0–53)
AST: 45 U/L — ABNORMAL HIGH (ref 0–37)
Albumin: 4.6 g/dL (ref 3.5–5.2)
Alkaline Phosphatase: 43 U/L (ref 39–117)
BILIRUBIN TOTAL: 1.6 mg/dL — AB (ref 0.3–1.2)
BUN: 13 mg/dL (ref 6–23)
CALCIUM: 9.7 mg/dL (ref 8.4–10.5)
CHLORIDE: 97 meq/L (ref 96–112)
CO2: 28 meq/L (ref 19–32)
Creat: 0.86 mg/dL (ref 0.50–1.35)
GLUCOSE: 82 mg/dL (ref 70–99)
Potassium: 4.3 mEq/L (ref 3.5–5.3)
SODIUM: 136 meq/L (ref 135–145)
TOTAL PROTEIN: 7.7 g/dL (ref 6.0–8.3)

## 2013-05-31 LAB — LIPID PANEL
CHOLESTEROL: 165 mg/dL (ref 0–200)
HDL: 43 mg/dL (ref >39)
LDL Cholesterol: 97 mg/dL (ref 0–99)
TRIGLYCERIDES: 127 mg/dL (ref <150)
Total CHOL/HDL Ratio: 3.8 Ratio
VLDL: 25 mg/dL (ref 0–40)

## 2013-05-31 LAB — GLUCOSE, POCT (MANUAL RESULT ENTRY): POC GLUCOSE: 91 mg/dL (ref 70–99)

## 2013-05-31 LAB — POCT GLYCOSYLATED HEMOGLOBIN (HGB A1C): HEMOGLOBIN A1C: 6.3

## 2013-05-31 MED ORDER — GLIPIZIDE ER 5 MG PO TB24: 5.0000 mg | ORAL_TABLET | Freq: Every day | ORAL | Status: DC

## 2013-05-31 MED ORDER — METFORMIN HCL ER (OSM) 1000 MG PO TB24: 1000.0000 mg | ORAL_TABLET | Freq: Two times a day (BID) | ORAL | Status: DC

## 2013-05-31 MED ORDER — ASPIRIN EC 81 MG PO TBEC: 81.0000 mg | DELAYED_RELEASE_TABLET | Freq: Every day | ORAL | Status: DC

## 2013-05-31 NOTE — Progress Notes (Signed)
Subjective:    Patient ID: Todd [name], male    DOB: 24-Dec-1960, 53 y.o.   MRN: [id number] Chief Complaint  Patient presents with  . Follow-up    FASTING CHOLESTEROL    HPI Misses metformin a few times/mo - hard to take since it is a big pill but not really >1x/wk. Taking glucotrol every day. Drinking less. Was running 1 mi/d w/ kids until weather turns cold. 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  Past Medical History  Diagnosis Date  . Diabetes mellitus without complication    Current Outpatient Prescriptions on File Prior to Visit  Medication Sig Dispense Refill  . tadalafil (CIALIS) 20 MG tablet Take 20 mg by mouth daily as needed.      . testosterone (ANDROGEL) 50 MG/5GM GEL Place 5 g onto the skin daily.      . Avanafil (STENDRA) 200 MG TABS Take by mouth. Take one tablet as needed.      . sildenafil (VIAGRA) 100 MG tablet Take 100 mg by mouth daily as needed for erectile dysfunction.       No current facility-administered medications on file prior to visit.   No Known Allergies  Review of Systems  Constitutional: Positive for fatigue and unexpected weight change. Negative for fever and chills.  HENT: Positive for ear pain.   Eyes: Negative for visual disturbance.  Respiratory: Negative for shortness of breath.   Cardiovascular: Negative for chest pain and leg swelling.  Neurological: Positive for headaches. Negative for dizziness, syncope, facial asymmetry, weakness and light-headedness.  Psychiatric/Behavioral: Negative for sleep disturbance.       BP 124/84  Pulse 76  Temp(Src) 97.6 F (36.4 C) (Oral)  Resp 16  Ht 5' 7.8" (1.722 m)  Wt 168 lb 12.8 oz (76.567 kg)  BMI 25.82 kg/m2  SpO2 99% Objective:   Physical Exam  Constitutional: He is oriented to person, place, and time. He appears well-developed and well-nourished. No distress.  HENT:  Head: Normocephalic and atraumatic.  Right Ear: External ear and ear canal normal. Tympanic  membrane is injected. A middle ear effusion is present.  Left Ear: External ear normal.  Left ear canal obscured by hard cerumen, failed removal by lavage so i attempted curette and eventually removed w/ alligator forceps.  Eyes: Conjunctivae are normal. Pupils are equal, round, and reactive to light. No scleral icterus.  Neck: Normal range of motion. Neck supple. No thyromegaly present.  Cardiovascular: Normal rate, regular rhythm, normal heart sounds and intact distal pulses.   Pulmonary/Chest: Effort normal and breath sounds normal. No respiratory distress.  Musculoskeletal: He exhibits no edema.  Lymphadenopathy:    He has no cervical adenopathy.  Neurological: He is alert and oriented to person, place, and time.  Skin: Skin is warm and dry. He is not diaphoretic.  Psychiatric: He has a normal mood and affect. His behavior is normal.          Assessment & Plan:  Type II or unspecified type diabetes mellitus without mention of complication, uncontrolled - Plan: POCT glucose (manual entry), POCT glycosylated hemoglobin (Hb A1C), Microalbumin/Creatinine Ratio, Urine, Lipid panel - rec starting asa and make f/u appt w/ optho.  Doing great on metformin 1000 bid and glucotrol 5.  Cerumen impaction, left - attempted curette, lavage, removed w/ alligator forceps by myself  Encounter for long-term (current) use of other medications - Plan: Comprehensive metabolic panel  Hypogonadism male - followed by Dr. Jeffie Pollock  Elevated liver function tests due to hepatic steatosis and alcohol abuse - recheck CMP today  HPL - lipids elev in past but reluctant to use statin due to elev LFTs - heck flp today.  If he has to start cholesterol medication, would prefer pravastatin.  HM - TDaP UTD - given in 2010.  No prior pneumvax - pt declines today but will cons at f/u.  Prior colonoscopy was done by dr. Deatra Ina in 03/2007 - severe diverticulosis but no reports of polyps. Actual report not seen - just f/u  letter from Dr. Deatra Ina.  Last EKG was 2007 - scanned into Epic.    Rec sched CPE in 6 mos w/ f/u. Meds ordered this encounter  Medications  . aspirin EC 81 MG tablet    Sig: Take 1 tablet (81 mg total) by mouth daily.    Dispense:  90 tablet    Refill:  4  . glipiZIDE (GLUCOTROL XL) 5 MG 24 hr tablet    Sig: Take 1 tablet (5 mg total) by mouth daily.    Dispense:  90 tablet    Refill:  1  . metformin (FORTAMET) 1000 MG (OSM) 24 hr tablet    Sig: Take 1 tablet (1,000 mg total) by mouth 2 (two) times daily with a meal.    Dispense:  180 tablet    Refill:  1    Delman Cheadle, MD MPH

## 2013-05-31 NOTE — Patient Instructions (Signed)
Make an appointment with your eye doctor before your next visit with me.  We will want to give you your pneumonia shot at your next office visit.  Diabetes and Standards of Medical Care  Diabetes is complicated. You may find that your diabetes team includes a dietitian, nurse, diabetes educator, eye doctor, and more. To help everyone know what is going on and to help you get the care you deserve, the following schedule of care was developed to help keep you on track. Below are the tests, exams, vaccines, medicines, education, and plans you will need. HbA1c test This test shows how well you have controlled your glucose over the past 2 3 months. It is used to see if your diabetes management plan needs to be adjusted.   It is performed at least 2 times a year if you are meeting treatment goals.  It is performed 4 times a year if therapy has changed or if you are not meeting treatment goals. Blood pressure test  This test is performed at every routine medical visit. The goal is less than 140/90 mmHg for most people, but 130/80 mmHg in some cases. Ask your health care provider about your goal. Dental exam  Follow up with the dentist regularly. Eye exam  If you are diagnosed with type 1 diabetes as a child, get an exam upon reaching the age of 64 years or older and have had diabetes for 3 5 years. Yearly eye exams are recommended after that initial eye exam.  If you are diagnosed with type 1 diabetes as an adult, get an exam within 5 years of diagnosis and then yearly.  If you are diagnosed with type 2 diabetes, get an exam as soon as possible after the diagnosis and then yearly. Foot care exam  Visual foot exams are performed at every routine medical visit. The exams check for cuts, injuries, or other problems with the feet.  A comprehensive foot exam should be done yearly. This includes visual inspection as well as assessing foot pulses and testing for loss of sensation.  Check your feet  nightly for cuts, injuries, or other problems with your feet. Tell your health care provider if anything is not healing. Kidney function test (urine microalbumin)  This test is performed once a year.  Type 1 diabetes: The first test is performed 5 years after diagnosis.  Type 2 diabetes: The first test is performed at the time of diagnosis.  A serum creatinine and estimated glomerular filtration rate (eGFR) test is done once a year to assess the level of chronic kidney disease (CKD), if present. Lipid profile (cholesterol, HDL, LDL, triglycerides)  Performed every 5 years for most people.  The goal for LDL is less than 100 mg/dL. If you are at high risk, the goal is less than 70 mg/dL.  The goal for HDL is 40 mg/dL 50 mg/dL for men and 50 mg/dL 60 mg/dL for women. An HDL cholesterol of 60 mg/dL or higher gives some protection against heart disease.  The goal for triglycerides is less than 150 mg/dL. Influenza vaccine, pneumococcal vaccine, and hepatitis B vaccine  The influenza vaccine is recommended yearly.  The pneumococcal vaccine is generally given once in a lifetime. However, there are some instances when another vaccination is recommended. Check with your health care provider.  The hepatitis B vaccine is also recommended for adults with diabetes. Diabetes self-management education  Education is recommended at diagnosis and ongoing as needed. Treatment plan  Your treatment plan is  reviewed at every medical visit. Document Released: 03/06/2009 Document Revised: 01/09/2013 Document Reviewed: 10/09/2012 [facility] Patient Information 2014 [place]. Pneumococcal Vaccine, Polyvalent solution for injection What is this medicine? PNEUMOCOCCAL VACCINE, POLYVALENT (NEU mo KOK al vak SEEN, pol ee VEY luhnt) is a vaccine to prevent pneumococcus bacteria infection. These bacteria are a major cause of ear infections, Strep throat infections, and serious pneumonia, meningitis, or  blood infections worldwide. These vaccines help the body to produce antibodies (protective substances) that help your body defend against these bacteria. This vaccine is recommended for people 5 years of age and older with health problems. It is also recommended for all adults over 10 years old. This vaccine will not treat an infection. This medicine may be used for other purposes; ask your health care provider or pharmacist if you have questions. COMMON BRAND NAME(S): Pneumovax 23 What should I tell my health care provider before I take this medicine? They need to know if you have any of these conditions: -bleeding problems -bone marrow or organ transplant -cancer, Hodgkin's disease -fever -infection -immune system problems -low platelet count in the blood -seizures -an unusual or allergic reaction to pneumococcal vaccine, diphtheria toxoid, other vaccines, latex, other medicines, foods, dyes, or preservatives -pregnant or trying to get pregnant -breast-feeding How should I use this medicine? This vaccine is for injection into a muscle or under the skin. It is given by a health care professional. A copy of Vaccine Information Statements will be given before each vaccination. Read this sheet carefully each time. The sheet may change frequently. Talk to your pediatrician regarding the use of this medicine in children. While this drug may be prescribed for children as young as 30 years of age for selected conditions, precautions do apply. Overdosage: If you think you have taken too much of this medicine contact a poison control center or emergency room at once. NOTE: This medicine is only for you. Do not share this medicine with others. What if I miss a dose? It is important not to miss your dose. Call your doctor or health care professional if you are unable to keep an appointment. What may interact with this medicine? -medicines for cancer chemotherapy -medicines that suppress your immune  function -medicines that treat or prevent blood clots like warfarin, enoxaparin, and dalteparin -steroid medicines like prednisone or cortisone This list may not describe all possible interactions. Give your health care provider a list of all the medicines, herbs, non-prescription drugs, or dietary supplements you use. Also tell them if you smoke, drink alcohol, or use illegal drugs. Some items may interact with your medicine. What should I watch for while using this medicine? Mild fever and pain should go away in 3 days or less. Report any unusual symptoms to your doctor or health care professional. What side effects may I notice from receiving this medicine? Side effects that you should report to your doctor or health care professional as soon as possible: -allergic reactions like skin rash, itching or hives, swelling of the face, lips, or tongue -breathing problems -confused -fever over 102 degrees F -pain, tingling, numbness in the hands or feet -seizures -unusual bleeding or bruising -unusual muscle weakness Side effects that usually do not require medical attention (report to your doctor or health care professional if they continue or are bothersome): -aches and pains -diarrhea -fever of 102 degrees F or less -headache -irritable -loss of appetite -pain, tender at site where injected -trouble sleeping This list may not describe all possible side  effects. Call your doctor for medical advice about side effects. You may report side effects to FDA at 1-800-FDA-1088. Where should I keep my medicine? This does not apply. This vaccine is given in a clinic, pharmacy, doctor's office, or other health care setting and will not be stored at home. NOTE: This sheet is a summary. It may not cover all possible information. If you have questions about this medicine, talk to your doctor, pharmacist, or health care provider.  2014, [employer]vier/Gold Standard. (2007-12-14 14:32:37)

## 2013-06-01 LAB — MICROALBUMIN / CREATININE URINE RATIO
Creatinine, Urine: 234.4 mg/dL
Microalb Creat Ratio: 4.4 mg/g (ref 0.0–30.0)
Microalb, Ur: 1.04 mg/dL (ref 0.00–1.89)

## 2013-06-03 ENCOUNTER — Telehealth: Payer: Self-pay

## 2013-06-03 MED ORDER — METFORMIN HCL ER 500 MG PO TB24: 1000.0000 mg | ORAL_TABLET | Freq: Two times a day (BID) | ORAL | Status: DC

## 2013-06-03 NOTE — Telephone Encounter (Signed)
Pharm faxed req to change metformin Rx to 500 mg XR tablets, 2 tabs BID d/t cost being a lot less than the 1000 mg. Sending in new Rx.

## 2013-06-04 ENCOUNTER — Encounter: Payer: Self-pay | Admitting: Family Medicine

## 2013-06-10 ENCOUNTER — Telehealth: Payer: Self-pay

## 2013-06-10 NOTE — Telephone Encounter (Signed)
Pt states that Dr.Shaw explained to him that she was not going to change how he took his metformin, he states that when he went to the pharmacy it was dosage was different. Pt would like to know why this change occured. Best # [phone] Pharmacy: [facility].

## 2013-06-11 NOTE — Telephone Encounter (Signed)
Looks like pharmacy called [place] and requested it be changed due to cost - see prior phone note of 1/12.

## 2013-06-11 NOTE — Telephone Encounter (Signed)
Did not mean to change it. Just refilled it  - likely a computer default thing. Fine to cont taking as he always has. Is he taking 2 tabs qhs? If so, please change directions to avoid further confusion in future.

## 2013-06-13 ENCOUNTER — Telehealth: Payer: Self-pay

## 2013-06-13 MED ORDER — METFORMIN HCL ER (OSM) 1000 MG PO TB24: 1000.0000 mg | ORAL_TABLET | Freq: Two times a day (BID) | ORAL | Status: DC

## 2013-06-13 NOTE — Telephone Encounter (Signed)
Patient is still confused about his medication he says will somebody PLEASE call him back (see previous phone messages) [phone]

## 2013-06-13 NOTE — Telephone Encounter (Signed)
I changed RX and sent to [facility].

## 2013-06-13 NOTE — Telephone Encounter (Signed)
Yes, this is fine thanks. 3 mo supply w/ 1 refill.

## 2013-06-13 NOTE — Telephone Encounter (Signed)
Spoke with patient and he wants to go back to what he was on before it was only a 10 dollar difference for 3 month supply. He wants a new rx sent to [facility] on [address] for Metformin ER 1000mg  1 po bid with meal. 3 month supply. He wants to continue what he has been on since it has worked so well and so he wants to only have to take 2 pills a day instead of 4.

## 2013-09-18 LAB — CBC AND DIFFERENTIAL: Hemoglobin: 14.3 g/dL (ref 13.5–17.5)

## 2013-09-26 ENCOUNTER — Encounter: Payer: Self-pay | Admitting: *Deleted

## 2013-09-26 DIAGNOSIS — N529 Male erectile dysfunction, unspecified: Secondary | ICD-10-CM | POA: Insufficient documentation

## 2013-10-25 ENCOUNTER — Other Ambulatory Visit: Payer: Self-pay

## 2013-10-25 MED ORDER — METFORMIN HCL ER (OSM) 1000 MG PO TB24: 1000.0000 mg | ORAL_TABLET | Freq: Two times a day (BID) | ORAL | Status: DC

## 2013-11-29 ENCOUNTER — Encounter: Payer: Self-pay | Admitting: Family Medicine

## 2013-11-29 NOTE — Progress Notes (Signed)
This encounter was created in error - please disregard.

## 2014-02-10 ENCOUNTER — Ambulatory Visit: Payer: BC Managed Care – PPO

## 2014-02-10 ENCOUNTER — Ambulatory Visit (INDEPENDENT_AMBULATORY_CARE_PROVIDER_SITE_OTHER): Payer: BC Managed Care – PPO | Admitting: Family Medicine

## 2014-02-10 ENCOUNTER — Ambulatory Visit (INDEPENDENT_AMBULATORY_CARE_PROVIDER_SITE_OTHER): Payer: BC Managed Care – PPO

## 2014-02-10 VITALS — BP 142/88 | HR 92 | Temp 97.3°F | Resp 16 | Ht 67.25 in | Wt 166.2 lb

## 2014-02-10 DIAGNOSIS — R9389 Abnormal findings on diagnostic imaging of other specified body structures: Secondary | ICD-10-CM

## 2014-02-10 DIAGNOSIS — M545 Low back pain, unspecified: Secondary | ICD-10-CM

## 2014-02-10 DIAGNOSIS — R748 Abnormal levels of other serum enzymes: Secondary | ICD-10-CM

## 2014-02-10 DIAGNOSIS — E119 Type 2 diabetes mellitus without complications: Secondary | ICD-10-CM

## 2014-02-10 LAB — COMPREHENSIVE METABOLIC PANEL
Alkaline Phosphatase: 55 U/L (ref 39–117)
BUN: 14 mg/dL (ref 6–23)
Glucose, Bld: 157 mg/dL — ABNORMAL HIGH (ref 70–99)
Total Bilirubin: 1.5 mg/dL — ABNORMAL HIGH (ref 0.2–1.2)

## 2014-02-10 LAB — COMPREHENSIVE METABOLIC PANEL WITH GFR
ALT: 67 U/L — ABNORMAL HIGH (ref 0–53)
AST: 47 U/L — ABNORMAL HIGH (ref 0–37)
Albumin: 5.1 g/dL (ref 3.5–5.2)
CO2: 19 meq/L (ref 19–32)
Calcium: 9.9 mg/dL (ref 8.4–10.5)
Chloride: 99 meq/L (ref 96–112)
Creat: 0.9 mg/dL (ref 0.50–1.35)
Potassium: 4.9 meq/L (ref 3.5–5.3)
Sodium: 135 meq/L (ref 135–145)
Total Protein: 8.3 g/dL (ref 6.0–8.3)

## 2014-02-10 LAB — GLUCOSE, POCT (MANUAL RESULT ENTRY): POC Glucose: 152 mg/dL — AB (ref 70–99)

## 2014-02-10 LAB — POCT GLYCOSYLATED HEMOGLOBIN (HGB A1C): Hemoglobin A1C: 7.9

## 2014-02-10 MED ORDER — GLIPIZIDE ER 5 MG PO TB24: 5.0000 mg | ORAL_TABLET | Freq: Every day | ORAL | Status: DC

## 2014-02-10 MED ORDER — METFORMIN HCL ER (OSM) 1000 MG PO TB24: 1000.0000 mg | ORAL_TABLET | Freq: Two times a day (BID) | ORAL | Status: DC

## 2014-02-10 MED ORDER — MELOXICAM 7.5 MG PO TABS: 7.5000 mg | ORAL_TABLET | Freq: Two times a day (BID) | ORAL | Status: DC

## 2014-02-10 MED ORDER — CYCLOBENZAPRINE HCL 5 MG PO TABS: 5.0000 mg | ORAL_TABLET | Freq: Every evening | ORAL | Status: DC | PRN

## 2014-02-10 MED ORDER — OXYCODONE HCL 5 MG PO TABS: 5.0000 mg | ORAL_TABLET | Freq: Every evening | ORAL | Status: DC | PRN

## 2014-02-10 NOTE — Progress Notes (Signed)
 Chief Complaint:  Chief Complaint  Patient presents with  . Back Pain    runs down into right leg x 5 days  . A1c check    HPI: Todd [name] is a 53 y.o. male who is here for :  1. 5 day history of low back pain with radiation to right leg, posteriorly, he has no numbness or tingling, he has pain radaition to th emid cald. HE has pain especially when he lays down. HE was moving a big trampoline and may have pulled a muscle. He di dnot hear anything. He hurt a lot the next day and he figured he pulled a muscle but he tried taking motrin but did not have any relief.   2. His diabetes has not been checked since January 2015. He has been only taking his metformin since ran out of glipizide. He is not utd on eye exam, denies neuropathy.He has nto had any hypoglycemic events but not sure since does not measure his sugars, he is a Engineer, drilling. Denies polydipsia or polyuria  Lab Results  Component Value Date   HGBA1C 7.9 02/10/2014   HGBA1C 6.3 05/31/2013   HGBA1C 8.1 01/18/2013   Lab Results  Component Value Date   MICROALBUR 1.04 05/31/2013   LDLCALC 97 05/31/2013   CREATININE 0.86 05/31/2013     Past Medical History  Diagnosis Date  . Diabetes mellitus without complication    History reviewed. No pertinent past surgical history. History   Social History  . Marital Status: Single    Spouse Name: N/A    Number of Children: N/A  . Years of Education: N/A   Social History Main Topics  . Smoking status: Never Smoker   . Smokeless tobacco: Never Used  . Alcohol Use: Yes  . Drug Use: No  . Sexual Activity: Yes    Birth Control/ Protection: None     Comment: number of sex partners in the last 70 months 4   Other Topics Concern  . None   Social History Narrative  . None   Family History  Problem Relation Age of Onset  . Diabetes Father   . Diabetes Brother    No Known Allergies Prior to Admission medications   Medication Sig Start Date End Date Taking?  Authorizing Provider  aspirin EC 81 MG tablet Take 1 tablet (81 mg total) by mouth daily. 05/31/13  Yes Shawnee Knapp, MD  glipiZIDE (GLUCOTROL XL) 5 MG 24 hr tablet Take 1 tablet (5 mg total) by mouth daily. 05/31/13  Yes Shawnee Knapp, MD  metformin (FORTAMET) 1000 MG (OSM) 24 hr tablet Take 1 tablet (1,000 mg total) by mouth 2 (two) times daily with a meal. PATIENT NEEDS OFFICE VISIT FOR ADDITIONAL REFILLS 10/25/13  Yes Shawnee Knapp, MD  sildenafil (VIAGRA) 100 MG tablet Take 100 mg by mouth daily as needed for erectile dysfunction.   Yes Historical Provider, MD  tadalafil (CIALIS) 20 MG tablet Take 20 mg by mouth daily as needed.   Yes Historical Provider, MD  testosterone (ANDROGEL) 50 MG/5GM GEL Place 5 g onto the skin daily.   Yes Historical Provider, MD  Avanafil (STENDRA) 200 MG TABS Take by mouth. Take one tablet as needed.    Historical Provider, MD     ROS: The patient denies fevers, chills, night sweats, unintentional weight loss, chest pain, palpitations, wheezing, dyspnea on exertion, nausea, vomiting, abdominal pain, dysuria, hematuria, melena, numbness, weakness, or tingling.  All other systems have been reviewed and were otherwise negative with the exception of those mentioned in the HPI and as above.    PHYSICAL EXAM: Filed Vitals:   02/10/14 0938  BP: 142/88  Pulse: 92  Temp: 97.3 F (36.3 C)  Resp: 16   Filed Vitals:   02/10/14 0938  Height: 5' 7.25" (1.708 m)  Weight: 166 lb 3.2 oz (75.388 kg)   Body mass index is 25.84 kg/(m^2).  General: Alert, no acute distress HEENT:  Normocephalic, atraumatic, oropharynx patent. EOMI, PERRLA Cardiovascular:  Regular rate and rhythm, no rubs murmurs or gallops.  No Carotid bruits, radial pulse intact. No pedal edema.  Respiratory: Clear to auscultation bilaterally.  No wheezes, rales, or rhonchi.  No cyanosis, no use of accessory musculature GI: No organomegaly, abdomen is soft and non-tender, positive bowel sounds.  No masses. Skin:  No rashes. Neurologic: Facial musculature symmetric. Microfilament exam normal Psychiatric: Patient is appropriate throughout our interaction. Lymphatic: No cervical lymphadenopathy Musculoskeletal: Gait antalgic + paramsk tenderness right low back Full ROM but pain with extension and also some SB 5/5 strength, 2/2 DTRs No saddle anesthesia Straight leg negative Hip and knee exam--normal    LABS: Results for orders placed in visit on 02/10/14  GLUCOSE, POCT (MANUAL RESULT ENTRY)      Result Value Ref Range   POC Glucose 152 (*) 70 - 99 mg/dl  POCT GLYCOSYLATED HEMOGLOBIN (HGB A1C)      Result Value Ref Range   Hemoglobin A1C 7.9       EKG/XRAY:   Primary read interpreted [facility]. Marin [facility]. ? Opacity left iliac spine , bone vs muscular No fx or dislocation + DJD   ASSESSMENT/PLAN: Encounter Diagnoses  Name Primary?  . Type II or unspecified type diabetes mellitus without mention of complication, not stated as uncontrolled Yes  . Right-sided low back pain without sciatica   . Abnormal x-ray examination   . Elevated liver enzymes    Refill meds Labs pending [place] on xray per official radiology  Rx mobic, flexeril and also oxycodone since can't take tylenol, drinks occ Narcotic profile pulled without any activity F/u in 3 months Recommend : ADA diet, BP goal <140/90, daily foot exams, tobacco cessation if smoking, annual eye exam, annual flu vaccine, PNA vaccine if age and time appropriate.   Gross sideeffects, risk and benefits, and alternatives of medications d/w patient. Patient is aware that all medications have potential sideeffects and we are unable to predict every sideeffect or drug-drug interaction that may occur.  , [place], DO 02/10/2014 12:40 PM

## 2014-02-14 ENCOUNTER — Encounter: Payer: Self-pay | Admitting: Family Medicine

## 2014-03-27 IMAGING — US US ABDOMEN LIMITED
1 series · 14 of 25 positions shown · non-contrast
Comparison: 3114

CLINICAL DATA: Persistently elevated LFTs.  Alcohol abuse.
Diabetes and hypertension

LIMITED ABDOMINAL ULTRASOUND

[Series 1: us abdomen limited · 0.33mm/px · 14 of 44 slices shown]
[im 1/44]
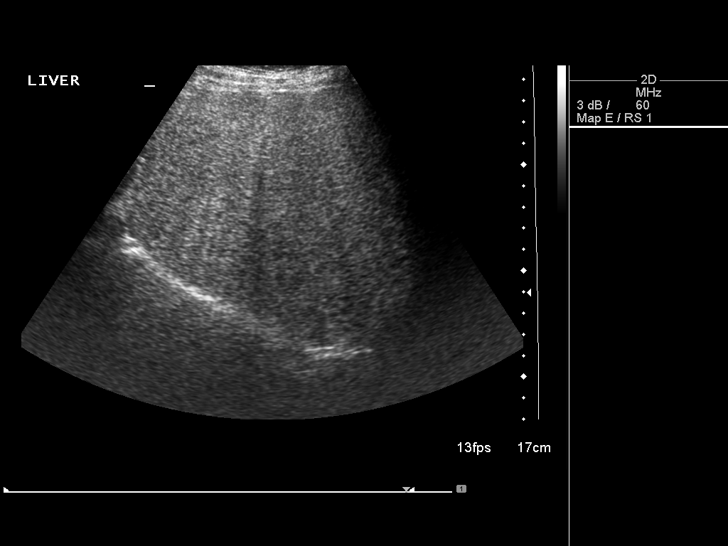
[im 4/44]
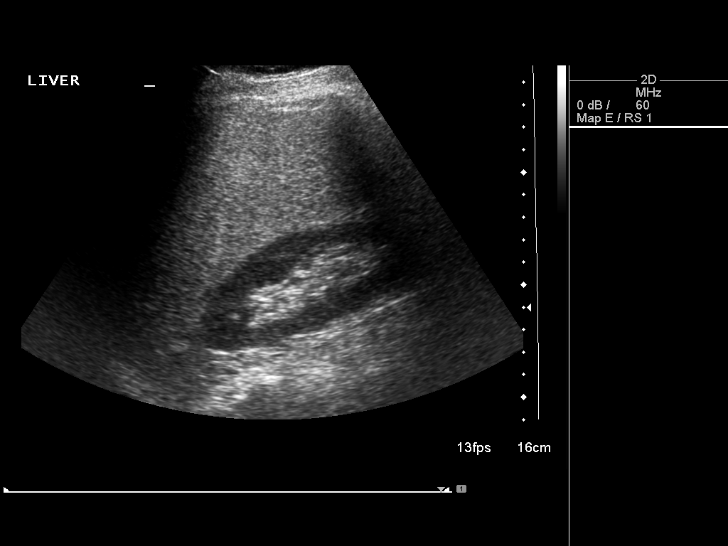
[im 8/44]
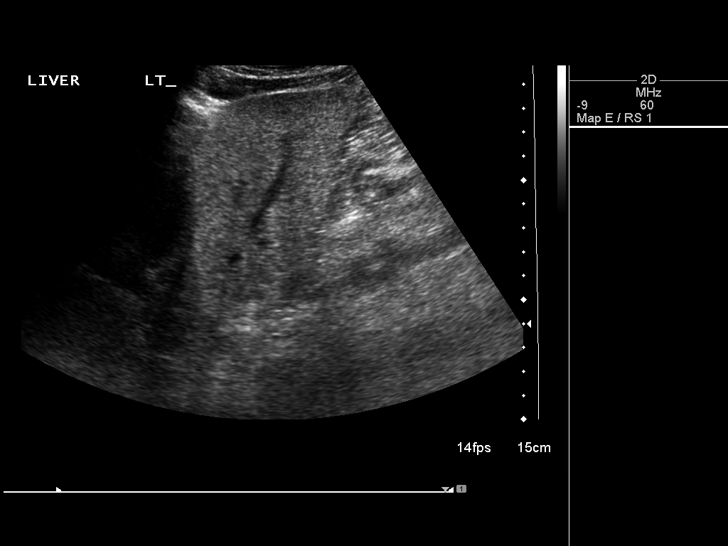
[im 11/44]
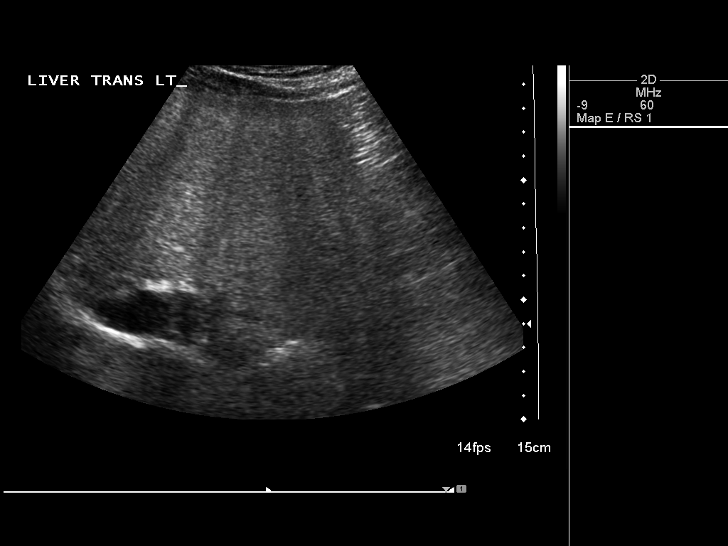
[im 15/44]
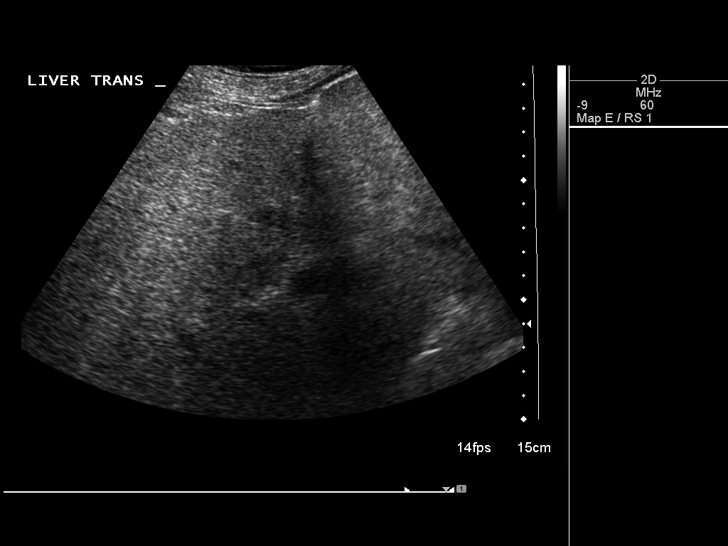
[im 17/44]
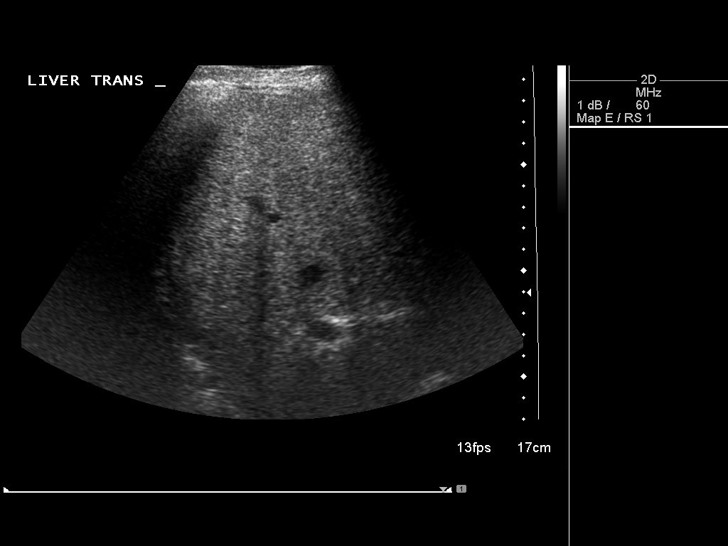
[im 20/44]
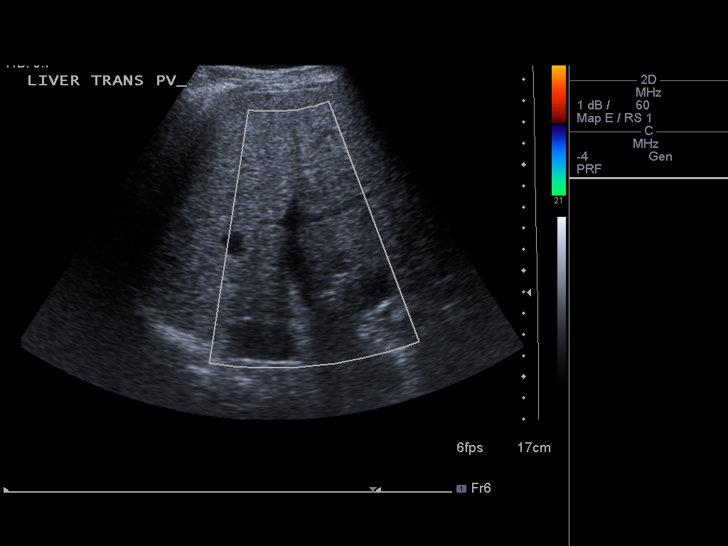
[im 24/44]
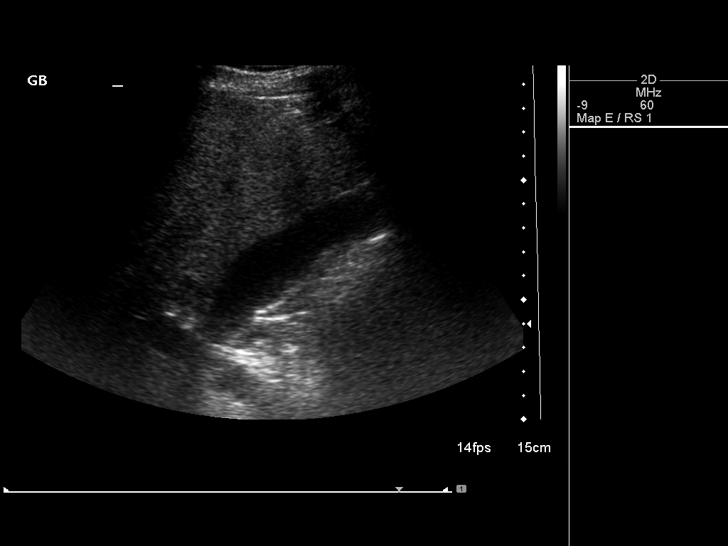
[im 27/44]
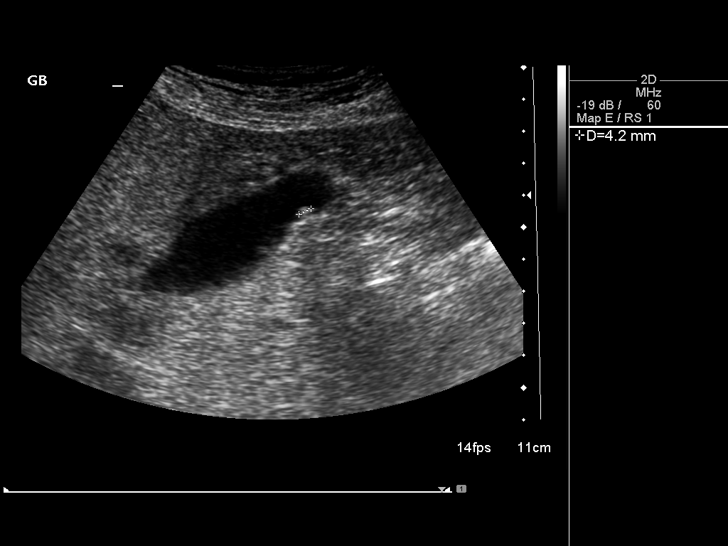
[im 29/44]
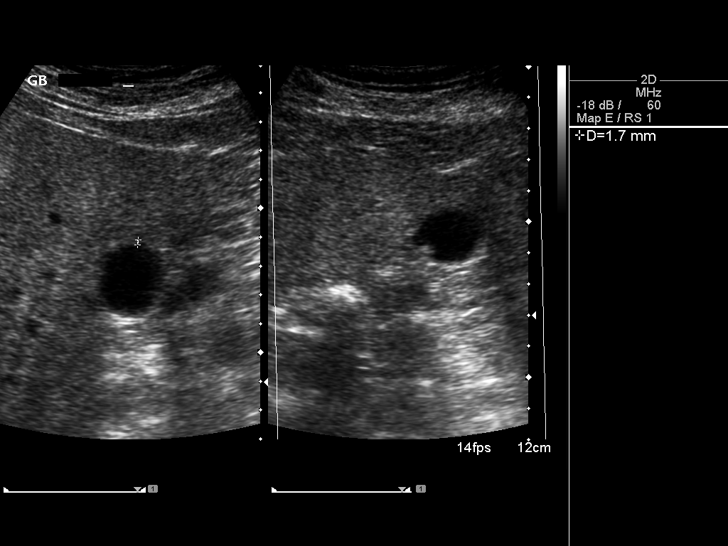
[im 33/44]
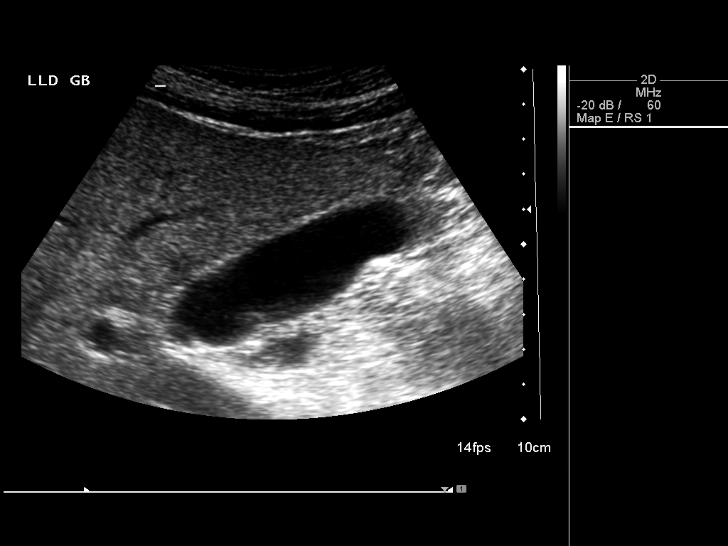
[im 36/44]
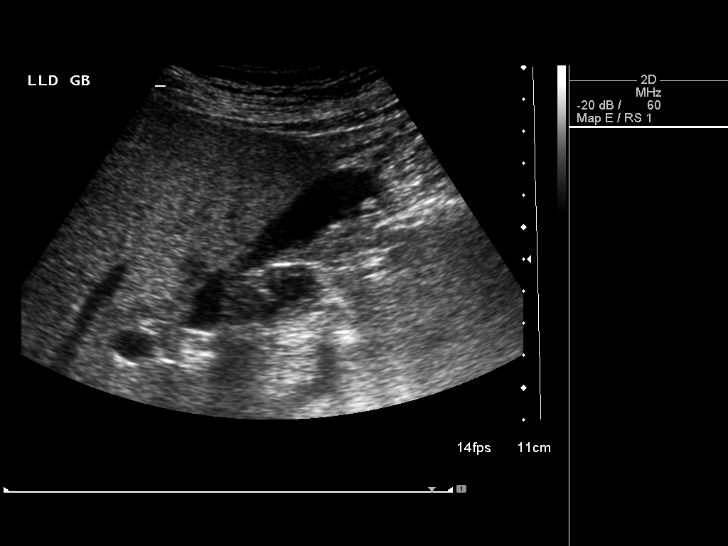
[im 40/44]
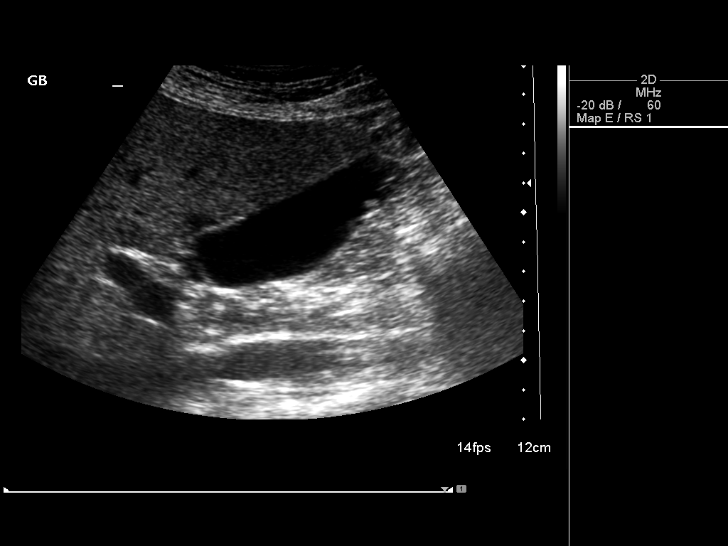
[im 44/44]
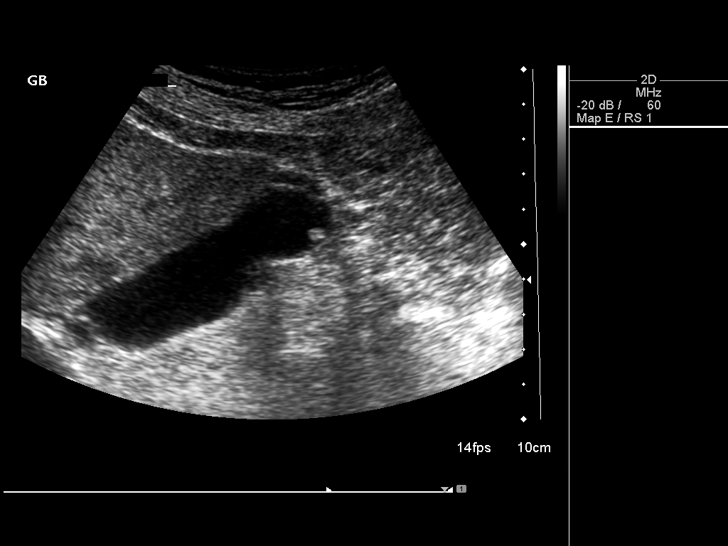

[14 of 25 positions shown; findings below may reference images not displayed]

FINDINGS: The gallbladder is well distended and demonstrates a
nonshadowing echogenic focus associated with the posterior fundal
wall which is not mobile compatible with a small polyp.  No
pericholecystic fluid, gallbladder wall thickening, intraluminal
stones or sludge are noted.  Evaluation for a sonographic Murphy's
sign is negative.

The common bile duct has a maximal caliber of 4.8 mm and normal
appearance.

The liver is increased in echotexture and demonstrates a mildly
heterogeneous pattern with no definite focal abnormality or signs
of intrahepatic ductal dilatation and the appearance is compatible
with underlying hepatic steatosis or hepatocellular disease.

No right upper quadrant fluid is seen.

The single provided sagittal view of the right kidney shows no
signs of hydronephrosis.
IMPRESSION: Solitary gallbladder polyp.

Findings compatible with underlying hepatic steatosis or
hepatocellular disease with no focal abnormalities seen..

## 2015-03-10 ENCOUNTER — Ambulatory Visit (INDEPENDENT_AMBULATORY_CARE_PROVIDER_SITE_OTHER): Payer: 59 | Admitting: Family Medicine

## 2015-03-10 ENCOUNTER — Ambulatory Visit (INDEPENDENT_AMBULATORY_CARE_PROVIDER_SITE_OTHER): Payer: 59

## 2015-03-10 VITALS — BP 140/98 | HR 92 | Temp 98.5°F | Resp 16 | Ht 67.0 in | Wt 156.0 lb

## 2015-03-10 DIAGNOSIS — R109 Unspecified abdominal pain: Secondary | ICD-10-CM

## 2015-03-10 DIAGNOSIS — E119 Type 2 diabetes mellitus without complications: Secondary | ICD-10-CM

## 2015-03-10 DIAGNOSIS — K5732 Diverticulitis of large intestine without perforation or abscess without bleeding: Secondary | ICD-10-CM | POA: Diagnosis not present

## 2015-03-10 LAB — POCT URINALYSIS DIP (MANUAL ENTRY)
BILIRUBIN UA: NEGATIVE
Glucose, UA: >=1000 — AB
LEUKOCYTES UA: NEGATIVE
Nitrite, UA: NEGATIVE
PH UA: 5.5
Protein Ur, POC: 30 — AB
Spec Grav, UA: 1.015
Urobilinogen, UA: 1

## 2015-03-10 LAB — COMPREHENSIVE METABOLIC PANEL
ALBUMIN: 4.7 g/dL (ref 3.6–5.1)
ALT: 111 U/L — ABNORMAL HIGH (ref 9–46)
AST: 83 U/L — ABNORMAL HIGH (ref 10–35)
Alkaline Phosphatase: 51 U/L (ref 40–115)
BUN: 10 mg/dL (ref 7–25)
CHLORIDE: 97 mmol/L — AB (ref 98–110)
CO2: 26 mmol/L (ref 20–31)
CREATININE: 0.83 mg/dL (ref 0.70–1.33)
Calcium: 9.6 mg/dL (ref 8.6–10.3)
Glucose, Bld: 272 mg/dL — ABNORMAL HIGH (ref 65–99)
Potassium: 5.1 mmol/L (ref 3.5–5.3)
SODIUM: 137 mmol/L (ref 135–146)
TOTAL PROTEIN: 7.8 g/dL (ref 6.1–8.1)
Total Bilirubin: 1.8 mg/dL — ABNORMAL HIGH (ref 0.2–1.2)

## 2015-03-10 LAB — POCT CBC
GRANULOCYTE PERCENT: 75.3 % (ref 37–80)
HEMATOCRIT: 42.1 % — AB (ref 43.5–53.7)
Hemoglobin: 14.9 g/dL (ref 14.1–18.1)
Lymph, poc: 1.3 (ref 0.6–3.4)
MCH: 31.6 pg — AB (ref 27–31.2)
MCHC: 35.3 g/dL (ref 31.8–35.4)
MCV: 89.3 fL (ref 80–97)
MID (CBC): 0.1 (ref 0–0.9)
MPV: 6 fL (ref 0–99.8)
POC GRANULOCYTE: 4.3 (ref 2–6.9)
POC LYMPH PERCENT: 22.4 %L (ref 10–50)
POC MID %: 2.3 % (ref 0–12)
Platelet Count, POC: 183 10*3/uL (ref 142–424)
RBC: 4.71 M/uL (ref 4.69–6.13)
RDW, POC: 13.3 %
WBC: 5.7 10*3/uL (ref 4.6–10.2)

## 2015-03-10 LAB — HEMOGLOBIN A1C: HEMOGLOBIN A1C: 10.6 % — AB (ref 4.0–6.0)

## 2015-03-10 LAB — GLUCOSE, POCT (MANUAL RESULT ENTRY): POC Glucose: 261 mg/dl — AB (ref 70–99)

## 2015-03-10 LAB — POCT GLYCOSYLATED HEMOGLOBIN (HGB A1C): Hemoglobin A1C: 10.6

## 2015-03-10 LAB — POC MICROSCOPIC URINALYSIS (UMFC): Mucus: ABSENT

## 2015-03-10 MED ORDER — GLIPIZIDE ER 5 MG PO TB24: 5.0000 mg | ORAL_TABLET | Freq: Every day | ORAL | Status: DC

## 2015-03-10 MED ORDER — CIPROFLOXACIN HCL 500 MG PO TABS: 500.0000 mg | ORAL_TABLET | Freq: Two times a day (BID) | ORAL | Status: DC

## 2015-03-10 MED ORDER — METFORMIN HCL ER (OSM) 1000 MG PO TB24
1000.0000 mg | ORAL_TABLET | Freq: Two times a day (BID) | ORAL | Status: DC
Start: 2015-03-10 — End: 2015-03-18

## 2015-03-10 NOTE — Patient Instructions (Signed)
Drink plenty of water  Take MiraLAX daily until stools are looser, then on an as-needed basis  Take ciprofloxacin one daily for infection  Advise minimal alcohol intake  Take the diabetic medications faithfully, metformin and glipizide  Plan to return in about 3 or 4 months to recheck on your diabetes.  Return at any time or go to the emergency room if the abdominal pain is worsening or you are just getting worse such as vomiting, bowel problems, or fevers.

## 2015-03-10 NOTE — Progress Notes (Addendum)
Patient ID: Todd [name], male    DOB: [birth year]  Age: 54 y.o. MRN: [id number]  Chief Complaint  Patient presents with  . Abdominal Pain    x 1 week  . Medication Refill  . no appetite    x 1 week    Subjective:   54 year old man who is here with abdominal pain. It's been hurting him over the past 1 week or so. He has left sided and generalized abdominal pain. It is fairly persistent. It feels like he needs to move his bowels, but he doesn't pass a lot when he does. He has not been having fever. No nausea or vomiting. When he eats or couple of hours after eating gets the pain much more intensely. It bothers him more when he sitting up than when he is lying down.  He has a history of diabetes, has been out of medicine for several months. Finances have been a big issue. He does currently have a new insurance. He is working a temporary job. He does not check his blood sugars. He does not smoke. He does drink about 12 pack of beer and a bottle or 2 of wine a week. He spreads this out, more when watching sports. He is not taking any acute current medications.  He does have a history of diverticulitis and had a colonoscopy last 8 years ago. On reviewing that he did have fairly extensive diverticula in the descending colon area.  Current allergies, medications, problem list, past/family and social histories reviewed.  Objective:  BP 140/98 mmHg  Pulse 92  Temp(Src) 98.5 F (36.9 C) (Oral)  Resp 16  Ht 5\' 7"  (1.702 m)  Wt 156 lb (70.761 kg)  BMI 24.43 kg/m2  SpO2 99%  Healthy-appearing man in no acute distress. Throat clear. Neck supple without nodes. Chest clear to auscultation. Heart regular without murmurs. Abdomen has bowel sounds present. Not tender to percussion. Mild left-sided tenderness on palpation. No severe pain currently. Fever.    Assessment & Plan:   Assessment: 1. Abdominal pain, left lateral   2. Type 2 diabetes mellitus without complication, without long-term current  use of insulin ([place])   3. Diverticulitis of colon without hemorrhage       Plan: We'll do a 2 view abdomen and check labs on him and proceed from there. See orders. Results for orders placed or performed in visit on 03/10/15  POCT CBC  Result Value Ref Range   WBC 5.7 4.6 - 10.2 K/uL   Lymph, poc 1.3 0.6 - 3.4   POC LYMPH PERCENT 22.4 10 - 50 %L   MID (cbc) 0.1 0 - 0.9   POC MID % 2.3 0 - 12 %M   POC Granulocyte 4.3 2 - 6.9   Granulocyte percent 75.3 37 - 80 %G   RBC 4.71 4.69 - 6.13 M/uL   Hemoglobin 14.9 14.1 - 18.1 g/dL   HCT, POC 42.1 (A) 43.5 - 53.7 %   MCV 89.3 80 - 97 fL   MCH, POC 31.6 (A) 27 - 31.2 pg   MCHC 35.3 31.8 - 35.4 g/dL   RDW, POC 13.3 %   Platelet Count, POC 183 142 - 424 K/uL   MPV 6.0 0 - 99.8 fL  POCT glucose (manual entry)  Result Value Ref Range   POC Glucose 261 (A) 70 - 99 mg/dl  POCT glycosylated hemoglobin (Hb A1C)  Result Value Ref Range   Hemoglobin A1C 10.6   POCT Microscopic Urinalysis ([facility])  Result Value Ref Range   WBC,UR,HPF,POC None None WBC/hpf   RBC,UR,HPF,POC None None RBC/hpf   Bacteria None None   Mucus Absent Absent   Epithelial Cells, UR Per Microscopy None None cells/hpf  POCT urinalysis dipstick  Result Value Ref Range   Color, UA yellow yellow   Clarity, UA clear clear   Glucose, UA >=1,000 (A) negative   Bilirubin, UA negative negative   Ketones, POC UA moderate (40) (A) negative   Spec Grav, UA 1.015    Blood, UA trace-intact (A) negative   pH, UA 5.5    Protein Ur, POC =30 (A) negative   Urobilinogen, UA 1.0    Nitrite, UA Negative Negative   Leukocytes, UA Negative Negative    [facility] reading (PRIMARY) by  Dr. Linna Darner Nonspecific gas stool pattern .        Patient Instructions  Drink plenty of water  Take MiraLAX daily until stools are looser, then on an as-needed basis  Take ciprofloxacin one daily for infection  Advise minimal alcohol intake  Take the diabetic medications faithfully, metformin  and glipizide  Plan to return in about 3 or 4 months to recheck on your diabetes.  Return at any time or go to the emergency room if the abdominal pain is worsening or you are just getting worse such as vomiting, bowel problems, or fevers.     Return in about 3 months (around 06/10/2015).   Maple Odaniel, MD 03/10/2015

## 2015-03-11 LAB — MICROALBUMIN, URINE: Microalb, Ur: 6.8 mg/dL

## 2015-03-18 ENCOUNTER — Telehealth: Payer: Self-pay

## 2015-03-18 DIAGNOSIS — E119 Type 2 diabetes mellitus without complications: Secondary | ICD-10-CM

## 2015-03-18 MED ORDER — METFORMIN HCL ER 500 MG PO TB24: 1000.0000 mg | ORAL_TABLET | Freq: Two times a day (BID) | ORAL | Status: DC

## 2015-03-18 NOTE — Telephone Encounter (Signed)
Sent in new Rx for metformin ER 500, two tabs BID to replace the 1000 ER tabs d/t much lower cost. Asked pharm to explain dose change and I also called and LMOM for pt explaining the change.

## 2015-03-20 ENCOUNTER — Encounter: Payer: Self-pay | Admitting: Family Medicine

## 2015-06-10 ENCOUNTER — Telehealth: Payer: Self-pay | Admitting: Family Medicine

## 2015-06-10 NOTE — Telephone Encounter (Signed)
Left a message for patient to return call to schedule a 3 month diabetes check with Dr. Brigitte Pulse.  Need to get it scheduled before the middle of February if at all possible.

## 2015-07-16 ENCOUNTER — Ambulatory Visit (INDEPENDENT_AMBULATORY_CARE_PROVIDER_SITE_OTHER): Payer: BLUE CROSS/BLUE SHIELD | Admitting: Physician Assistant

## 2015-07-16 ENCOUNTER — Ambulatory Visit (INDEPENDENT_AMBULATORY_CARE_PROVIDER_SITE_OTHER): Payer: BLUE CROSS/BLUE SHIELD

## 2015-07-16 VITALS — BP 120/82 | HR 105 | Temp 98.9°F | Resp 16 | Ht 67.5 in | Wt 161.0 lb

## 2015-07-16 DIAGNOSIS — Z8719 Personal history of other diseases of the digestive system: Secondary | ICD-10-CM

## 2015-07-16 DIAGNOSIS — R1032 Left lower quadrant pain: Secondary | ICD-10-CM | POA: Diagnosis not present

## 2015-07-16 LAB — POCT CBC
GRANULOCYTE PERCENT: 78.4 % (ref 37–80)
HCT, POC: 38.3 % — AB (ref 43.5–53.7)
Hemoglobin: 13.5 g/dL — AB (ref 14.1–18.1)
Lymph, poc: 1.8 (ref 0.6–3.4)
MCH: 31.6 pg — AB (ref 27–31.2)
MCHC: 35.1 g/dL (ref 31.8–35.4)
MCV: 89.9 fL (ref 80–97)
MID (CBC): 0.5 (ref 0–0.9)
MPV: 6.6 fL (ref 0–99.8)
PLATELET COUNT, POC: 205 10*3/uL (ref 142–424)
POC Granulocyte: 6.7 (ref 2–6.9)
POC LYMPH PERCENT: 21.2 %L (ref 10–50)
POC MID %: 0.4 % (ref 0–12)
RBC: 4.26 M/uL — AB (ref 4.69–6.13)
RDW, POC: 12.7 %
WBC: 8.6 10*3/uL (ref 4.6–10.2)

## 2015-07-16 MED ORDER — CIPROFLOXACIN HCL 500 MG PO TABS: 500.0000 mg | ORAL_TABLET | Freq: Two times a day (BID) | ORAL | Status: DC

## 2015-07-16 MED ORDER — METRONIDAZOLE 500 MG PO TABS: 500.0000 mg | ORAL_TABLET | Freq: Three times a day (TID) | ORAL | Status: DC

## 2015-07-16 NOTE — Patient Instructions (Signed)
Because you received an x-ray today, you will receive an invoice from [facility]. Please contact [facility] at [phone] with questions or concerns regarding your invoice. Our billing staff will not be able to assist you with those questions. °

## 2015-07-16 NOTE — Progress Notes (Signed)
07/17/2015 11:23 AM   DOB: 02/20/1961 / MRN: [id number]  SUBJECTIVE:  Todd [name] is a 55 y.o. male presenting for abdominal pain.  He has a history of diverticulitis last diagnosed by Dr. Linna Darner in October of 2016.  He was placed on Cipro at that time and had no problems, and reports good relief of his symptoms.    He has No Known Allergies.   He  has a past medical history of Diabetes mellitus without complication ([place]).    He  reports that he has never smoked. He has never used smokeless tobacco. He reports that he drinks alcohol. He reports that he does not use illicit drugs. He  reports that he currently engages in sexual activity. He reports using the following method of birth control/protection: None. The patient  has no past surgical history on file.  His family history includes Diabetes in his brother and father.  Review of Systems  Constitutional: Negative for fever.  Respiratory: Negative for cough.   Cardiovascular: Negative for chest pain.  Gastrointestinal: Positive for abdominal pain. Negative for constipation and blood in stool.  Genitourinary: Negative for dysuria, urgency, frequency, hematuria and flank pain.  Skin: Negative for rash.  Neurological: Negative for dizziness and headaches.    Problem list and medications reviewed and updated by myself where necessary, and exist elsewhere in the encounter.   OBJECTIVE:  BP 120/82 mmHg  Pulse 105  Temp(Src) 98.9 F (37.2 C) (Oral)  Resp 16  Ht 5' 7.5" (1.715 m)  Wt 161 lb (73.029 kg)  BMI 24.83 kg/m2  SpO2 98%  Physical Exam  Constitutional: He is oriented to person, place, and time. He appears well-developed. He does not appear ill.  Eyes: Conjunctivae and EOM are normal. Pupils are equal, round, and reactive to light.  Cardiovascular: Normal rate.   Pulmonary/Chest: Effort normal.  Abdominal: Soft. Bowel sounds are normal. He exhibits no distension and no mass. There is tenderness (llq only). There is no  rebound and no guarding.  Musculoskeletal: Normal range of motion.  Neurological: He is alert and oriented to person, place, and time. No cranial nerve deficit. Coordination normal.  Skin: Skin is warm and dry. He is not diaphoretic.  Psychiatric: He has a normal mood and affect.  Nursing note and vitals reviewed.   Results for orders placed or performed in visit on 07/16/15 (from the past 72 hour(s))  POCT CBC     Status: Abnormal   Collection Time: 07/16/15  5:10 PM  Result Value Ref Range   WBC 8.6 4.6 - 10.2 K/uL   Lymph, poc 1.8 0.6 - 3.4   POC LYMPH PERCENT 21.2 10 - 50 %L   MID (cbc) 0.5 0 - 0.9   POC MID % 0.4 0 - 12 %M   POC Granulocyte 6.7 2 - 6.9   Granulocyte percent 78.4 37 - 80 %G   RBC 4.26 (A) 4.69 - 6.13 M/uL   Hemoglobin 13.5 (A) 14.1 - 18.1 g/dL   HCT, POC 38.3 (A) 43.5 - 53.7 %   MCV 89.9 80 - 97 fL   MCH, POC 31.6 (A) 27 - 31.2 pg   MCHC 35.1 31.8 - 35.4 g/dL   RDW, POC 12.7 %   Platelet Count, POC 205 142 - 424 K/uL   MPV 6.6 0 - 99.8 fL    Dg Abd 2 Views  07/16/2015  CLINICAL DATA:  55 year old male with left lower quadrant abdominal pain. History of diverticulosis. EXAM: ABDOMEN -  2 VIEW COMPARISON:  Abdominal radiograph 03/10/2015. FINDINGS: Gas and stool are seen scattered throughout the colon extending to the level of the distal rectum. No pathologic distension of small bowel is noted. No gross evidence of pneumoperitoneum. Again noted is a well-circumscribed 1.6 cm sclerotic lesion projecting over the medial aspect of the upper left ilium, which is similar to prior studies dating back to at least 02/18/2009, favored to represent a bone island. IMPRESSION: 1.  Nonobstructive bowel gas pattern. 2. No pneumoperitoneum. Electronically Signed   By: Vinnie Langton M.D.   On: 07/16/2015 17:33   Lab Results  Component Value Date   HGBA1C 10.6 03/10/2015    ASSESSMENT AND PLAN  [name] was seen today for abdominal pain.  Diagnoses and all orders for this  visit:  History of diverticulitis: He has had similar presentations in the past with god resolution using Cipro. Rad reassuring and negative for acute intrabdominal process.  WIll treat for diverticular disease.  RTC immediately if worse, follow up PRN otherwise.   Abdominal pain, left lower quadrant -     POCT CBC -     DG Abd 2 Views; Future -     ciprofloxacin (CIPRO) 500 MG tablet; Take 1 tablet (500 mg total) by mouth 2 (two) times daily. -     metroNIDAZOLE (FLAGYL) 500 MG tablet; Take 1 tablet (500 mg total) by mouth 3 (three) times daily.      The patient was advised to call or return to clinic if he does not see an improvement in symptoms or to seek the care of the closest emergency department if he worsens with the above plan.   Philis Fendt, MHS, PA-C [facility] 07/17/2015 11:23 AM

## 2015-10-12 ENCOUNTER — Other Ambulatory Visit: Payer: Self-pay | Admitting: Family Medicine

## 2015-10-22 ENCOUNTER — Ambulatory Visit (INDEPENDENT_AMBULATORY_CARE_PROVIDER_SITE_OTHER): Payer: BLUE CROSS/BLUE SHIELD | Admitting: Urgent Care

## 2015-10-22 VITALS — BP 118/82 | HR 102 | Temp 98.0°F | Resp 18 | Ht 67.5 in | Wt 157.0 lb

## 2015-10-22 DIAGNOSIS — E119 Type 2 diabetes mellitus without complications: Secondary | ICD-10-CM

## 2015-10-22 DIAGNOSIS — R202 Paresthesia of skin: Secondary | ICD-10-CM

## 2015-10-22 DIAGNOSIS — R200 Anesthesia of skin: Secondary | ICD-10-CM

## 2015-10-22 DIAGNOSIS — E1165 Type 2 diabetes mellitus with hyperglycemia: Secondary | ICD-10-CM

## 2015-10-22 LAB — POCT GLYCOSYLATED HEMOGLOBIN (HGB A1C): HEMOGLOBIN A1C: 7.3

## 2015-10-22 MED ORDER — GLIPIZIDE ER 5 MG PO TB24: 5.0000 mg | ORAL_TABLET | Freq: Every day | ORAL | Status: DC

## 2015-10-22 MED ORDER — METFORMIN HCL ER 500 MG PO TB24: 1000.0000 mg | ORAL_TABLET | Freq: Two times a day (BID) | ORAL | Status: DC

## 2015-10-22 NOTE — Patient Instructions (Addendum)
Diabetes Mellitus and Food It is important for you to manage your blood sugar (glucose) level. Your blood glucose level can be greatly affected by what you eat. Eating healthier foods in the appropriate amounts throughout the day at about the same time each day will help you control your blood glucose level. It can also help slow or prevent worsening of your diabetes mellitus. Healthy eating may even help you improve the level of your blood pressure and reach or maintain a healthy weight.  General recommendations for healthful eating and cooking habits include:  Eating meals and snacks regularly. Avoid going long periods of time without eating to lose weight.  Eating a diet that consists mainly of plant-based foods, such as fruits, vegetables, nuts, legumes, and whole grains.  Using low-heat cooking methods, such as baking, instead of high-heat cooking methods, such as deep frying. Work with your dietitian to make sure you understand how to use the Nutrition Facts information on food labels. HOW CAN FOOD AFFECT ME? Carbohydrates Carbohydrates affect your blood glucose level more than any other type of food. Your dietitian will help you determine how many carbohydrates to eat at each meal and teach you how to count carbohydrates. Counting carbohydrates is important to keep your blood glucose at a healthy level, especially if you are using insulin or taking certain medicines for diabetes mellitus. Alcohol Alcohol can cause sudden decreases in blood glucose (hypoglycemia), especially if you use insulin or take certain medicines for diabetes mellitus. Hypoglycemia can be a life-threatening condition. Symptoms of hypoglycemia (sleepiness, dizziness, and disorientation) are similar to symptoms of having too much alcohol.  If your health care provider has given you approval to drink alcohol, do so in moderation and use the following guidelines:  Women should not have more than one drink per day, and men  should not have more than two drinks per day. One drink is equal to:  12 oz of beer.  5 oz of wine.  1 oz of hard liquor.  Do not drink on an empty stomach.  Keep yourself hydrated. Have water, diet soda, or unsweetened iced tea.  Regular soda, juice, and other mixers might contain a lot of carbohydrates and should be counted. WHAT FOODS ARE NOT RECOMMENDED? As you make food choices, it is important to remember that all foods are not the same. Some foods have fewer nutrients per serving than other foods, even though they might have the same number of calories or carbohydrates. It is difficult to get your body what it needs when you eat foods with fewer nutrients. Examples of foods that you should avoid that are high in calories and carbohydrates but low in nutrients include:  Trans fats (most processed foods list trans fats on the Nutrition Facts label).  Regular soda.  Juice.  Candy.  Sweets, such as cake, pie, doughnuts, and cookies.  Fried foods. WHAT FOODS CAN I EAT? Eat nutrient-rich foods, which will nourish your body and keep you healthy. The food you should eat also will depend on several factors, including:  The calories you need.  The medicines you take.  Your weight.  Your blood glucose level.  Your blood pressure level.  Your cholesterol level. You should eat a variety of foods, including:  Protein.  Lean cuts of meat.  Proteins low in saturated fats, such as fish, egg whites, and beans. Avoid processed meats.  Fruits and vegetables.  Fruits and vegetables that may help control blood glucose levels, such as apples, mangoes, and   yams.  Dairy products.  Choose fat-free or low-fat dairy products, such as milk, yogurt, and cheese.  Grains, bread, pasta, and rice.  Choose whole grain products, such as multigrain bread, whole oats, and brown rice. These foods may help control blood pressure.  Fats.  Foods containing healthful fats, such as nuts,  avocado, olive oil, canola oil, and fish. DOES EVERYONE WITH DIABETES MELLITUS HAVE THE SAME MEAL PLAN? Because every person with diabetes mellitus is different, there is not one meal plan that works for everyone. It is very important that you meet with a dietitian who will help you create a meal plan that is just right for you.   This information is not intended to replace advice given to you by your health care provider. Make sure you discuss any questions you have with your health care provider.   Document Released: 02/03/2005 Document Revised: 05/30/2014 Document Reviewed: 04/05/2013 Elsevier Interactive Patient Education 2016 [employer].     IF you received an x-ray today, you will receive an invoice from [facility]. Please contact [facility] at [phone] with questions or concerns regarding your invoice.   IF you received labwork today, you will receive an invoice from [employer]. Please contact [facility] at [phone] with questions or concerns regarding your invoice.   Our billing staff will not be able to assist you with questions regarding bills from these companies.  You will be contacted with the lab results as soon as they are available. The fastest way to get your results is to activate your My Chart account. Instructions are located on the last page of this paperwork. If you have not heard from [place] regarding the results in 2 weeks, please contact this office.

## 2015-10-22 NOTE — Progress Notes (Signed)
    MRN: [id number] DOB: 06/25/1960  Subjective:   Todd [name] is a 55 y.o. male presenting for chief complaint of A1C check  Reports that he is taking Metformin XR 1,000mg  BID and glipizide 5mg  daily. States that he was not taking his medication at his last visit. Admits non-compliance with his diet. Exercises regularly. Drinks 12 pack in a week. Denies smoking cigarettes. Admits polydipsia, intermittent blurred vision, intermittent numbness and tingling of his feet. Denies dizziness, chest pain, shortness of breath, nausea, vomiting, abdominal pain, polyuria, hematuria, skin infections.   Todd [name] has a current medication list which includes the following prescription(s): glipizide and metformin. Also has No Known Allergies.  Todd [name]  has a past medical history of Diabetes mellitus without complication ([place]). Also  has no past surgical history on file.  Objective:   Vitals: BP 118/82 mmHg  Pulse 102  Temp(Src) 98 F (36.7 C) (Oral)  Resp 18  Ht 5' 7.5" (1.715 m)  Wt 157 lb (71.215 kg)  BMI 24.21 kg/m2  SpO2 97%  Physical Exam  Constitutional: He is oriented to person, place, and time. He appears well-developed and well-nourished.  HENT:  Mouth/Throat: Oropharynx is clear and moist.  Eyes: Pupils are equal, round, and reactive to light. No scleral icterus.  Neck: Normal range of motion. Neck supple.  Cardiovascular: Normal rate, regular rhythm and intact distal pulses.  Exam reveals no gallop and no friction rub.   No murmur heard. Pulmonary/Chest: No respiratory distress. He has no wheezes. He has no rales.  Abdominal: Soft. Bowel sounds are normal.  Neurological: He is alert and oriented to person, place, and time.  Skin: Skin is warm and dry.   Results for orders placed or performed in visit on 10/22/15 (from the past 24 hour(s))  POCT glycosylated hemoglobin (Hb A1C)     Status: None   Collection Time: 10/22/15  3:56 PM  Result Value Ref Range   Hemoglobin A1C 7.3     Diabetic Foot Exam - Simple   Simple Foot Form  Diabetic Foot exam was performed with the following findings:  Yes 10/22/2015  4:05 PM  Visual Inspection  No deformities, no ulcerations, no other skin breakdown bilaterally:  Yes  Sensation Testing  Intact to touch and monofilament testing bilaterally:  Yes  Pulse Check  Posterior Tibialis and Dorsalis pulse intact bilaterally:  Yes  Comments     Assessment and Plan :   1. Controlled type 2 diabetes mellitus without complication, without long-term current use of insulin ([place]) 2. Numbness or tingling - Recommended dietary modifications. Refilled medications. Continue to monitor numbness and tingling of feet.  Jaynee Eagles, PA-C [facility] [phone] 10/22/2015 3:36 PM

## 2015-10-23 LAB — COMPLETE METABOLIC PANEL WITH GFR
ALBUMIN: 4.6 g/dL (ref 3.6–5.1)
ALK PHOS: 42 U/L (ref 40–115)
ALT: 61 U/L — AB (ref 9–46)
AST: 53 U/L — AB (ref 10–35)
BILIRUBIN TOTAL: 1.5 mg/dL — AB (ref 0.2–1.2)
BUN: 18 mg/dL (ref 7–25)
CALCIUM: 9.4 mg/dL (ref 8.6–10.3)
CO2: 22 mmol/L (ref 20–31)
CREATININE: 0.87 mg/dL (ref 0.70–1.33)
Chloride: 104 mmol/L (ref 98–110)
GFR, Est African American: >89 mL/min (ref >=60)
GFR, Est Non African American: >89 mL/min (ref >=60)
GLUCOSE: 163 mg/dL — AB (ref 65–99)
Potassium: 4.5 mmol/L (ref 3.5–5.3)
SODIUM: 140 mmol/L (ref 135–146)
TOTAL PROTEIN: 7.6 g/dL (ref 6.1–8.1)

## 2015-10-28 ENCOUNTER — Encounter: Payer: Self-pay | Admitting: Urgent Care

## 2016-06-29 ENCOUNTER — Other Ambulatory Visit: Payer: Self-pay | Admitting: Urgent Care

## 2016-06-29 DIAGNOSIS — E119 Type 2 diabetes mellitus without complications: Secondary | ICD-10-CM

## 2016-06-29 NOTE — Telephone Encounter (Signed)
Patient is due for f/u 09/2016. His last a1c was 7.3% on 10/2015. I was unable to leave voice message due to VM not being set up.

## 2016-11-24 ENCOUNTER — Telehealth: Payer: Self-pay

## 2016-11-24 NOTE — Telephone Encounter (Signed)
Attempted to call patient to so we could get him a appointment for a Rx refill request because pt hasn't been here in a year. Pt's voicemail wasn't set up.

## 2017-01-04 ENCOUNTER — Telehealth: Payer: Self-pay | Admitting: Urgent Care

## 2017-01-04 DIAGNOSIS — E119 Type 2 diabetes mellitus without complications: Secondary | ICD-10-CM

## 2017-01-07 ENCOUNTER — Other Ambulatory Visit: Payer: Self-pay | Admitting: Urgent Care

## 2017-01-07 DIAGNOSIS — E119 Type 2 diabetes mellitus without complications: Secondary | ICD-10-CM

## 2017-01-11 NOTE — Telephone Encounter (Signed)
UNABLE TO REACH VOICEMAIL NOT SET UP

## 2017-01-11 NOTE — Telephone Encounter (Signed)
SENT UNABLE TO REACH LETTER TO SCHEDULE AN OV FOR METFORMIN REFILL

## 2017-01-12 ENCOUNTER — Other Ambulatory Visit: Payer: Self-pay | Admitting: Urgent Care

## 2017-01-12 DIAGNOSIS — E119 Type 2 diabetes mellitus without complications: Secondary | ICD-10-CM

## 2019-04-10 ENCOUNTER — Ambulatory Visit
Admission: EM | Admit: 2019-04-10 | Discharge: 2019-04-10 | Discharge status: Absent without leave | Payer: BLUE CROSS/BLUE SHIELD

## 2019-04-12 DIAGNOSIS — Z79899 Other long term (current) drug therapy: Secondary | ICD-10-CM | POA: Diagnosis not present

## 2019-04-12 DIAGNOSIS — R799 Abnormal finding of blood chemistry, unspecified: Secondary | ICD-10-CM | POA: Diagnosis not present

## 2019-04-12 DIAGNOSIS — Z23 Encounter for immunization: Secondary | ICD-10-CM | POA: Diagnosis not present

## 2019-04-12 DIAGNOSIS — Z049 Encounter for examination and observation for unspecified reason: Secondary | ICD-10-CM | POA: Diagnosis not present

## 2019-04-12 DIAGNOSIS — R300 Dysuria: Secondary | ICD-10-CM | POA: Diagnosis not present

## 2019-05-03 ENCOUNTER — Telehealth: Payer: Self-pay

## 2019-05-03 NOTE — Telephone Encounter (Signed)
Called patient to do their pre-visit COVID screening.  Call went to voicemail which isn't set up. Unable to do prescreening.  

## 2019-05-06 ENCOUNTER — Other Ambulatory Visit: Payer: Self-pay

## 2019-05-06 ENCOUNTER — Ambulatory Visit (INDEPENDENT_AMBULATORY_CARE_PROVIDER_SITE_OTHER): Payer: BLUE CROSS/BLUE SHIELD | Admitting: Family Medicine

## 2019-05-06 ENCOUNTER — Other Ambulatory Visit: Payer: Self-pay | Admitting: Family Medicine

## 2019-05-06 VITALS — BP 137/88 | HR 85 | Temp 97.7°F | Resp 18 | Ht 66.0 in | Wt 145.0 lb

## 2019-05-06 DIAGNOSIS — R202 Paresthesia of skin: Secondary | ICD-10-CM | POA: Diagnosis not present

## 2019-05-06 DIAGNOSIS — R8290 Unspecified abnormal findings in urine: Secondary | ICD-10-CM | POA: Diagnosis not present

## 2019-05-06 DIAGNOSIS — Z7689 Persons encountering health services in other specified circumstances: Secondary | ICD-10-CM

## 2019-05-06 DIAGNOSIS — E1169 Type 2 diabetes mellitus with other specified complication: Secondary | ICD-10-CM | POA: Diagnosis not present

## 2019-05-06 DIAGNOSIS — N521 Erectile dysfunction due to diseases classified elsewhere: Secondary | ICD-10-CM

## 2019-05-06 DIAGNOSIS — D234 Other benign neoplasm of skin of scalp and neck: Secondary | ICD-10-CM

## 2019-05-06 DIAGNOSIS — Z1211 Encounter for screening for malignant neoplasm of colon: Secondary | ICD-10-CM

## 2019-05-06 DIAGNOSIS — R448 Other symptoms and signs involving general sensations and perceptions: Secondary | ICD-10-CM | POA: Diagnosis not present

## 2019-05-06 DIAGNOSIS — E119 Type 2 diabetes mellitus without complications: Secondary | ICD-10-CM | POA: Diagnosis not present

## 2019-05-06 LAB — POCT URINALYSIS DIP (CLINITEK)
Glucose, UA: NEGATIVE mg/dL
Leukocytes, UA: NEGATIVE
Nitrite, UA: NEGATIVE
POC PROTEIN,UA: 30 — AB
Spec Grav, UA: 1.025
Urobilinogen, UA: >=8 U/dL — AB
pH, UA: 7

## 2019-05-06 LAB — GLUCOSE, POCT (MANUAL RESULT ENTRY): POC Glucose: 107 mg/dL — AB (ref 70–99)

## 2019-05-06 MED ORDER — AMOXICILLIN-POT CLAVULANATE 500-125 MG PO TABS: 1.0000 | ORAL_TABLET | Freq: Two times a day (BID) | ORAL | 0 refills | Status: DC

## 2019-05-06 MED ORDER — SILDENAFIL CITRATE 100 MG PO TABS: 100.0000 mg | ORAL_TABLET | Freq: Every day | ORAL | 5 refills | Status: DC | PRN

## 2019-05-06 MED ORDER — METFORMIN HCL ER 500 MG PO TB24: 500.0000 mg | ORAL_TABLET | Freq: Two times a day (BID) | ORAL | 0 refills | Status: DC

## 2019-05-06 NOTE — Patient Instructions (Signed)
Blood Glucose Monitoring, Adult Monitoring your blood sugar (glucose) is an important part of managing your diabetes (diabetes mellitus). Blood glucose monitoring involves checking your blood glucose as often as directed and keeping a record (log) of your results over time. Checking your blood glucose regularly and keeping a blood glucose log can:  Help you and your health care provider adjust your diabetes management plan as needed, including your medicines or insulin.  Help you understand how food, exercise, illnesses, and medicines affect your blood glucose.  Let you know what your blood glucose is at any time. You can quickly find out if you have low blood glucose (hypoglycemia) or high blood glucose (hyperglycemia). Your health care provider will set individualized treatment goals for you. Your goals will be based on your age, other medical conditions you have, and how you respond to diabetes treatment. Generally, the goal of treatment is to maintain the following blood glucose levels:  Before meals (preprandial): 80-130 mg/dL (4.4-7.2 mmol/L).  After meals (postprandial): below 180 mg/dL (10 mmol/L).  A1c level: less than 7%. Supplies needed:  Blood glucose meter.  Test strips for your meter. Each meter has its own strips. You must use the strips that came with your meter.  A needle to prick your finger (lancet). Do not use a lancet more than one time.  A device that holds the lancet (lancing device).  A journal or log book to write down your results. How to check your blood glucose  1. Wash your hands with soap and water. 2. Prick the side of your finger (not the tip) with the lancet. Use a different finger each time. 3. Gently rub the finger until a small drop of blood appears. 4. Follow instructions that come with your meter for inserting the test strip, applying blood to the strip, and using your blood glucose meter. 5. Write down your result and any notes. Some meters  allow you to use areas of your body other than your finger (alternative sites) to test your blood. The most common alternative sites are:  Forearm.  Thigh.  Palm of the hand. If you think you may have hypoglycemia, or if you have a history of not knowing when your blood glucose is getting low (hypoglycemia unawareness), do not use alternative sites. Use your finger instead. Alternative sites may not be as accurate as the fingers, because blood flow is slower in these areas. This means that the result you get may be delayed, and it may be different from the result that you would get from your finger. Follow these instructions at home: Blood glucose log   Every time you check your blood glucose, write down your result. Also write down any notes about things that may be affecting your blood glucose, such as your diet and exercise for the day. This information can help you and your health care provider: ? Look for patterns in your blood glucose over time. ? Adjust your diabetes management plan as needed.  Check if your meter allows you to download your records to a computer. Most glucose meters store a record of glucose readings in the meter. If you have type 1 diabetes:  Check your blood glucose 2 or more times a day.  Also check your blood glucose: ? Before every insulin injection. ? Before and after exercise. ? Before meals. ? 2 hours after a meal. ? Occasionally between 2:00 a.m. and 3:00 a.m., as directed. ? Before potentially dangerous tasks, like driving or using heavy machinery. ?   At bedtime.  You may need to check your blood glucose more often, up to 6-10 times a day, if you: ? Use an insulin pump. ? Need multiple daily injections (MDI). ? Have diabetes that is not well-controlled. ? Are ill. ? Have a history of severe hypoglycemia. ? Have hypoglycemia unawareness. If you have type 2 diabetes:  If you take insulin or other diabetes medicines, check your blood glucose 2 or  more times a day.  If you are on intensive insulin therapy, check your blood glucose 4 or more times a day. Occasionally, you may also need to check between 2:00 a.m. and 3:00 a.m., as directed.  Also check your blood glucose: ? Before and after exercise. ? Before potentially dangerous tasks, like driving or using heavy machinery.  You may need to check your blood glucose more often if: ? Your medicine is being adjusted. ? Your diabetes is not well-controlled. ? You are ill. General tips  Always keep your supplies with you.  If you have questions or need help, all blood glucose meters have a 24-hour "hotline" phone number that you can call. You may also contact your health care provider.  After you use a few boxes of test strips, adjust (calibrate) your blood glucose meter by following instructions that came with your meter. Contact a health care provider if:  Your blood glucose is at or above 240 mg/dL (13.3 mmol/L) for 2 days in a row.  You have been sick or have had a fever for 2 days or longer, and you are not getting better.  You have any of the following problems for more than 6 hours: ? You cannot eat or drink. ? You have nausea or vomiting. ? You have diarrhea. Get help right away if:  Your blood glucose is lower than 54 mg/dL (3 mmol/L).  You become confused or you have trouble thinking clearly.  You have difficulty breathing.  You have moderate or large ketone levels in your urine. Summary  Monitoring your blood sugar (glucose) is an important part of managing your diabetes (diabetes mellitus).  Blood glucose monitoring involves checking your blood glucose as often as directed and keeping a record (log) of your results over time.  Your health care provider will set individualized treatment goals for you. Your goals will be based on your age, other medical conditions you have, and how you respond to diabetes treatment.  Every time you check your blood glucose,  write down your result. Also write down any notes about things that may be affecting your blood glucose, such as your diet and exercise for the day. This information is not intended to replace advice given to you by your health care provider. Make sure you discuss any questions you have with your health care provider. Document Released: 05/12/2003 Document Revised: 03/02/2018 Document Reviewed: 10/19/2015 Elsevier Patient Education  2020 [employer].  Diabetes Mellitus and Standards of Medical Care Managing diabetes (diabetes mellitus) can be complicated. Your diabetes treatment may be managed by a team of health care providers, including:  A physician who specializes in diabetes (endocrinologist).  A nurse practitioner or physician assistant.  Nurses.  A diet and nutrition specialist (registered dietitian).  A certified diabetes educator (CDE).  An exercise specialist.  A pharmacist.  An eye doctor.  A foot specialist (podiatrist).  A dentist.  A primary care provider.  A mental health provider. Your health care providers follow guidelines to help you get the best quality of care. The following  schedule is a general guideline for your diabetes management plan. Your health care providers may give you more specific instructions. Physical exams Upon being diagnosed with diabetes mellitus, and each year after that, your health care provider will ask about your medical and family history. He or she will also do a physical exam. Your exam may include:  Measuring your height, weight, and body mass index (BMI).  Checking your blood pressure. This will be done at every routine medical visit. Your target blood pressure may vary depending on your medical conditions, your age, and other factors.  Thyroid gland exam.  Skin exam.  Screening for damage to your nerves (peripheral neuropathy). This may include checking the pulse in your legs and feet and checking the level of sensation  in your hands and feet.  A complete foot exam to inspect the structure and skin of your feet, including checking for cuts, bruises, redness, blisters, sores, or other problems.  Screening for blood vessel (vascular) problems, which may include checking the pulse in your legs and feet and checking your temperature. Blood tests Depending on your treatment plan and your personal needs, you may have the following tests done:  HbA1c (hemoglobin A1c). This test provides information about blood sugar (glucose) control over the previous 2-3 months. It is used to adjust your treatment plan, if needed. This test will be done: ? At least 2 times a year, if you are meeting your treatment goals. ? 4 times a year, if you are not meeting your treatment goals or if treatment goals have changed.  Lipid testing, including total, LDL, and HDL cholesterol and triglyceride levels. ? The goal for LDL is less than 100 mg/dL (5.5 mmol/L). If you are at high risk for complications, the goal is less than 70 mg/dL (3.9 mmol/L). ? The goal for HDL is 40 mg/dL (2.2 mmol/L) or higher for men and 50 mg/dL (2.8 mmol/L) or higher for women. An HDL cholesterol of 60 mg/dL (3.3 mmol/L) or higher gives some protection against heart disease. ? The goal for triglycerides is less than 150 mg/dL (8.3 mmol/L).  Liver function tests.  Kidney function tests.  Thyroid function tests. Dental and eye exams  Visit your dentist two times a year.  If you have type 1 diabetes, your health care provider may recommend an eye exam 3-5 years after you are diagnosed, and then once a year after your first exam. ? For children with type 1 diabetes, a health care provider may recommend an eye exam when your child is age 60 or older and has had diabetes for 3-5 years. After the first exam, your child should get an eye exam once a year.  If you have type 2 diabetes, your health care provider may recommend an eye exam as soon as you are diagnosed,  and then once a year after your first exam. Immunizations   The yearly flu (influenza) vaccine is recommended for everyone 6 months or older who has diabetes.  The pneumonia (pneumococcal) vaccine is recommended for everyone 2 years or older who has diabetes. If you are 16 or older, you may get the pneumonia vaccine as a series of two separate shots.  The hepatitis B vaccine is recommended for adults shortly after being diagnosed with diabetes.  Adults and children with diabetes should receive all other vaccines according to age-specific recommendations from the Centers for Disease Control and Prevention (CDC). Mental and emotional health Screening for symptoms of eating disorders, anxiety, and depression is recommended  at the time of diagnosis and afterward as needed. If your screening shows that you have symptoms (positive screening result), you may need more evaluation and you may work with a mental health care provider. Treatment plan Your treatment plan will be reviewed at every medical visit. You and your health care provider will discuss:  How you are taking your medicines, including insulin.  Any side effects you are experiencing.  Your blood glucose target goals.  The frequency of your blood glucose monitoring.  Lifestyle habits, such as activity level as well as tobacco, alcohol, and substance use. Diabetes self-management education Your health care provider will assess how well you are monitoring your blood glucose levels and whether you are taking your insulin correctly. He or she may refer you to:  A certified diabetes educator to manage your diabetes throughout your life, starting at diagnosis.  A registered dietitian who can create or review your personal nutrition plan.  An exercise specialist who can discuss your activity level and exercise plan. Summary  Managing diabetes (diabetes mellitus) can be complicated. Your diabetes treatment may be managed by a team of  health care providers.  Your health care providers follow guidelines in order to help you get the best quality of care.  Standards of care including having regular physical exams, blood tests, blood pressure monitoring, immunizations, screening tests, and education about how to manage your diabetes.  Your health care providers may also give you more specific instructions based on your individual health. This information is not intended to replace advice given to you by your health care provider. Make sure you discuss any questions you have with your health care provider. Document Released: 03/06/2009 Document Revised: 01/26/2018 Document Reviewed: 02/05/2016 Elsevier Patient Education  2020 [employer].

## 2019-05-06 NOTE — Progress Notes (Signed)
Subjective:  Patient ID: Todd [name], male    DOB: 1960-09-21  Age: 58 y.o. MRN: [id number]  CC: Establish Care (patient is fasting.) and Diabetes (doesn't check FSBS at home. has numbness/tingling in feet, polyuria. denies nausea, vomiting, polydipsia.)   HPI Todd [name], 58 year old male with diabetes who presents to establish care.  Last hemoglobin A1c in the chart is from 2017 and was 7.3 at that time.  He reports that his last hemoglobin A1c was about 6 months ago and he believes that it was 6 or less.  He reports that his primary care provider took him off of glipizide 5 mg at that time and decreased his Metformin from 2 pills twice daily to 2 pills in the morning and 1 pill in the evening as his blood sugars were so good at his last primary care diabetes visit.  He reports however that due to the COVID-19 pandemic he was laid off from his job and due to being at home, mild depression with being out of work, he ate more, drink more and was getting minimal exercise and he believes that his blood sugars have been higher.  He was recently recalled to return to his job and he believes that this is likely improved his blood sugars.  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  He does have some current increased thirst and frequent urination.  He denies any changes in vision.  He has felt at times as if he has having tingling and numbness in his feet, particularly on the right and left great toes.  He checks his blood sugars at home very infrequently.         He has not currently on any statin medication.  He also states that his father is diabetic and told him that if he started blood pressure medicine that he may never be able to get off of the medicine.  Patient states that sometimes his blood pressure at work has been  slightly elevated but then on recheck his blood pressure is normal within 15 minutes.  He is trying to avoid being on any type of blood pressure medication.  He has had no headaches or dizziness which he feels are related to his blood pressure.  He reports no chest pain or palpitations, no shortness of breath or cough.  He reports that he has never smoked.  He denies any abdominal pain-no nausea/vomiting/diarrhea or constipation.  He has had no blood in stool and no black stools.  He would like to do Cologuard if possible instead of colonoscopy but he reports he did have colonoscopy in the past and did not have any colon polyps and he does not have any family history of colon cancer.  He does have issues with erectile dysfunction for which he takes Viagra and he has had no issues with the medication. He will need upcoming refills.        Near the end of the visit, patient recalled that he has felt a small bump/nodule on the left posterior scalp that has gotten slightly bigger.  The area was harder but now is slightly soft/spongy.  Area is not tender/painful.  Has been present for over a month.   Past Medical History:  Diagnosis Date  . Diabetes mellitus without complication ([place])   .  Male erectile disorder     Past Surgical History:  Procedure Laterality Date  . NO PAST SURGERIES      Family History  Problem Relation Age of Onset  . Diabetes Father   . Diabetes Brother     Social History   Tobacco Use  . Smoking status: Never Smoker  . Smokeless tobacco: Never Used  Substance Use Topics  . Alcohol use: Yes    ROS Review of Systems  Constitutional: Negative for chills and fever.  HENT: Negative for sore throat and trouble swallowing.   Eyes: Negative for photophobia and visual disturbance.  Respiratory: Negative for cough and shortness of breath.   Cardiovascular: Negative for chest pain, palpitations and leg swelling.  Gastrointestinal: Negative for abdominal pain, blood in  stool, constipation, diarrhea and nausea.  Endocrine: Positive for polydipsia and polyuria. Negative for polyphagia.  Genitourinary: Positive for frequency. Negative for dysuria.  Musculoskeletal: Negative for arthralgias and back pain.  Skin: Negative for rash and wound.  Neurological: Positive for numbness. Negative for dizziness and headaches.  Hematological: Negative for adenopathy. Does not bruise/bleed easily.    Objective:   Today's Vitals: BP 137/88   Pulse 85   Temp 97.7 F (36.5 C) (Temporal)   Resp 18   Ht 5\' 6"  (1.676 m)   Wt 145 lb (65.8 kg)   SpO2 98%   BMI 23.40 kg/m   Physical Exam Vitals and nursing note reviewed.  Constitutional:      Appearance: Normal appearance.     Comments: Well-nourished well-developed male in no acute distress wearing mask as per office COVID-19 protocol  Neck:     Vascular: No carotid bruit.  Cardiovascular:     Rate and Rhythm: Normal rate and regular rhythm.     Pulses:          Dorsalis pedis pulses are 2+ on the right side and 2+ on the left side.       Posterior tibial pulses are 2+ on the right side and 2+ on the left side.  Pulmonary:     Effort: Pulmonary effort is normal.     Breath sounds: Normal breath sounds.  Abdominal:     Palpations: Abdomen is soft.     Tenderness: There is no abdominal tenderness. There is no right CVA tenderness, left CVA tenderness, guarding or rebound.  Musculoskeletal:        General: No swelling or tenderness.     Cervical back: Normal range of motion and neck supple. No rigidity or tenderness.     Right lower leg: No edema.     Left lower leg: No edema.  Feet:     Right foot:     Protective Sensation: 10 sites tested. 9 sites sensed.     Skin integrity: No ulcer, blister, skin breakdown, erythema, warmth, callus, dry skin or fissure.     Toenail Condition: Right toenails are normal.     Left foot:     Protective Sensation: 10 sites tested. 10 sites sensed.     Skin integrity: Callus  present. No ulcer, blister, skin breakdown, erythema, warmth, dry skin or fissure.     Toenail Condition: Left toenails are normal.     Comments: Patient with slight callus on the medial border of the left great toe.  Patient with nodule at distal joint of right second toe but otherwise no deformities.  Patient with absent monofilament sensation right great toe and decreased monofilament sensation left great toe. Lymphadenopathy:  Cervical: No cervical adenopathy.  Skin:    General: Skin is warm and dry.     Comments: Patient with pea-sized nodule on the left lateral posterior scalp has slight increased coloration compared to surrounding skin-more pink, area is slightly compressible, soft and nontender to touch  Neurological:     Mental Status: He is alert and oriented to person, place, and time.     Cranial Nerves: No cranial nerve deficit.     Sensory: Sensory deficit (Loss of monofilament sensation right great toe and decreased on the left great toe) present.  Psychiatric:        Mood and Affect: Mood normal.        Thought Content: Thought content normal.     Assessment & Plan:  1. Encounter to establish care 2. Type 2 diabetes mellitus without complication, unspecified whether long term insulin use ([place]) Patient presents to establish care for chronic medical issues including type 2 diabetes and erectile dysfunction.  Patient states that he has not eaten since about midnight last night.  Patient with fasting glucose today of 107.  Patient has restarted work and believes that this will help improve his blood sugar control.  Patient will have hemoglobin A1c, microalbumin creatinine, comprehensive metabolic panel, urinalysis and lipid panel at today's visit and follow-up of diabetes.  He reports that he has already had influenza immunization.  Diabetic foot care was discussed as patient with abnormal monofilament exam with absent monofilament sensation right great toe and decreased  monofilament sensation at the left great toe as well as a slight callus on the border of the left great toe.  Prescription provided for Metformin to take 2 pills twice daily and depending on A1c value, patient may need to resume use of glipizide though today's fasting blood sugar is very good.  Discussed importance of blood sugar monitoring and educational material on blood glucose monitoring given as part of AVS.  Also discussed possibility of ACE inhibitor or ARB for renal protection but patient is reluctant to start any medication for her blood pressure but discussed that medication also helps with renal protection.  He will be notified of today's results. - Glucose (CBG) - Microalbumin/Creatinine Ratio, Urine - Hemoglobin A1c - Comprehensive metabolic panel - POCT URINALYSIS DIP (CLINITEK) - Lipid Panel - metFORMIN (GLUCOPHAGE-XR) 500 MG 24 hr tablet; Take 1 tablet (500 mg total) by mouth 2 (two) times daily.  Dispense: 360 tablet; Refill: 0  3. Erectile dysfunction associated with type 2 diabetes mellitus ([place]) He reports longstanding issues with erectile dysfunction associated with type 2 diabetes and refill provided of generic Viagra as he states that he is taking this medication and it works well for him and he does not have any side effects from the use of the medication - sildenafil (VIAGRA) 100 MG tablet; Take 1 tablet (100 mg total) by mouth daily as needed.  Dispense: 4 tablet; Refill: 5  4. Screening for colon cancer Patient wishes to do Cologuard a screening test for colon cancer.  He was given pamphlet regarding Cologuard and he denies any family history of colon cancer and reports prior colonoscopy about 10 or more years ago that did not have any abnormalities.  Discussed with the patient that this test is for low risk individuals without prior abnormal colonoscopy and without family history of colon cancer. - Cologuard  5. Dermoid cyst of scalp Patient with a cyst on the left  posterior scalp which may be a sebaceous or dermoid.  As  the area is getting bigger per patient's report and it feels slightly soft to touch, prescription provided for Augmentin twice daily x10 days which patient may wait to use if area continues to enlarge or becomes tender or he may go ahead and use the antibiotic to see if the area decreases.  He should also call or return if he wishes to have referral for removal of the area. - amoxicillin-clavulanate (AUGMENTIN) 500-125 MG tablet; Take 1 tablet (500 mg total) by mouth 2 (two) times daily. Take after eating  Dispense: 20 tablet; Refill: 0    Outpatient Encounter Medications as of 05/06/2019  Medication Sig  . metFORMIN (GLUCOPHAGE-XR) 500 MG 24 hr tablet Take 1 tablet (500 mg total) by mouth 2 (two) times daily.  . sildenafil (VIAGRA) 100 MG tablet Take 1 tablet (100 mg total) by mouth daily as needed.  . [DISCONTINUED] metFORMIN (GLUCOPHAGE-XR) 500 MG 24 hr tablet TAKE TWO TABLETS BY MOUTH TWICE DAILY WITH A MEAL  . [DISCONTINUED] sildenafil (VIAGRA) 100 MG tablet Take 100 mg by mouth daily as needed.  Marland Kitchen amoxicillin-clavulanate (AUGMENTIN) 500-125 MG tablet Take 1 tablet (500 mg total) by mouth 2 (two) times daily. Take after eating  . [DISCONTINUED] glipiZIDE (GLUCOTROL XL) 5 MG 24 hr tablet Take 1 tablet (5 mg total) by mouth daily.   No facility-administered encounter medications on file as of 05/06/2019.    An After Visit Summary was printed and given to the patient.  Follow-up: Return in about 7 weeks (around 06/24/2019) for DM; otherwise f/u as needed.    Antony Blackbird MD

## 2019-05-06 NOTE — Addendum Note (Signed)
Addended by: Carylon Perches on: 05/06/2019 05:22 PM   Modules accepted: Orders

## 2019-05-06 NOTE — Progress Notes (Signed)
Abnormal findings on UA and order placed for urine culture

## 2019-05-07 LAB — HEMOGLOBIN A1C
Est. average glucose Bld gHb Est-mCnc: 131 mg/dL
Hgb A1c MFr Bld: 6.2 % — ABNORMAL HIGH (ref 4.8–5.6)

## 2019-05-07 LAB — COMPREHENSIVE METABOLIC PANEL WITH GFR
ALT: 47 IU/L — ABNORMAL HIGH (ref 0–44)
AST: 45 IU/L — ABNORMAL HIGH (ref 0–40)
Albumin/Globulin Ratio: 1.7 (ref 1.2–2.2)
Albumin: 4.5 g/dL (ref 3.8–4.9)
Alkaline Phosphatase: 45 IU/L (ref 39–117)
BUN/Creatinine Ratio: 12 (ref 9–20)
BUN: 10 mg/dL (ref 6–24)
Bilirubin Total: 1.3 mg/dL — ABNORMAL HIGH (ref 0.0–1.2)
CO2: 26 mmol/L (ref 20–29)
Calcium: 9.3 mg/dL (ref 8.7–10.2)
Chloride: 98 mmol/L (ref 96–106)
Creatinine, Ser: 0.84 mg/dL (ref 0.76–1.27)
GFR calc Af Amer: 113 mL/min/1.73
GFR calc non Af Amer: 98 mL/min/1.73
Globulin, Total: 2.7 g/dL (ref 1.5–4.5)
Glucose: 103 mg/dL — ABNORMAL HIGH (ref 65–99)
Potassium: 4.2 mmol/L (ref 3.5–5.2)
Sodium: 138 mmol/L (ref 134–144)
Total Protein: 7.2 g/dL (ref 6.0–8.5)

## 2019-05-07 LAB — LIPID PANEL
Chol/HDL Ratio: 3 ratio (ref 0.0–5.0)
Cholesterol, Total: 171 mg/dL (ref 100–199)
HDL: 57 mg/dL
LDL Chol Calc (NIH): 99 mg/dL (ref 0–99)
Triglycerides: 78 mg/dL (ref 0–149)
VLDL Cholesterol Cal: 15 mg/dL (ref 5–40)

## 2019-05-07 LAB — MICROALBUMIN / CREATININE URINE RATIO
Creatinine, Urine: 350.2 mg/dL
Microalb/Creat Ratio: 7 mg/g{creat} (ref 0–29)
Microalbumin, Urine: 23.8 ug/mL

## 2019-05-08 LAB — URINE CULTURE: Organism ID, Bacteria: NO GROWTH

## 2019-06-25 ENCOUNTER — Telehealth: Payer: Self-pay | Admitting: Internal Medicine

## 2019-06-25 NOTE — Telephone Encounter (Signed)
Attempted to call patient, patient ignored call, no VM set up.

## 2019-06-26 ENCOUNTER — Encounter: Payer: Self-pay | Admitting: Internal Medicine

## 2019-06-26 ENCOUNTER — Ambulatory Visit (INDEPENDENT_AMBULATORY_CARE_PROVIDER_SITE_OTHER): Payer: Managed Care, Other (non HMO) | Admitting: Internal Medicine

## 2019-06-26 VITALS — BP 147/94 | HR 112 | Temp 98.2°F | Resp 17 | Wt 139.6 lb

## 2019-06-26 DIAGNOSIS — R030 Elevated blood-pressure reading, without diagnosis of hypertension: Secondary | ICD-10-CM

## 2019-06-26 DIAGNOSIS — E1169 Type 2 diabetes mellitus with other specified complication: Secondary | ICD-10-CM | POA: Diagnosis not present

## 2019-06-26 DIAGNOSIS — E119 Type 2 diabetes mellitus without complications: Secondary | ICD-10-CM | POA: Diagnosis not present

## 2019-06-26 DIAGNOSIS — R590 Localized enlarged lymph nodes: Secondary | ICD-10-CM

## 2019-06-26 DIAGNOSIS — N521 Erectile dysfunction due to diseases classified elsewhere: Secondary | ICD-10-CM

## 2019-06-26 MED ORDER — SILDENAFIL CITRATE 100 MG PO TABS: 100.0000 mg | ORAL_TABLET | Freq: Every day | ORAL | 5 refills | Status: DC | PRN

## 2019-06-26 NOTE — Progress Notes (Signed)
Patient notified of results & recommendations. Expressed understanding.

## 2019-06-26 NOTE — Progress Notes (Signed)
Subjective:    Todd [name] - 59 y.o. male MRN [id number]  Date of birth: 1960/10/14  HPI  Todd [name] is here for follow up of DM.  Diabetes mellitus, Type 2 Disease Monitoring             Blood Sugar Ranges: Does not check              Polyuria: No              Visual problems: no   Urine Microalbumin 7 (Dec 2020)   Last A1C: 6.2 (05/06/19)   Medications: Metformin 500 mg BID  Medication Compliance: yes  Medication Side Effects             Hypoglycemia: no   Preventitive Health Care             Eye Exam: Needs, referral placed              Foot Exam: Performed 05/05/20   Neck Nodule: Reports has been there for 5-7 days right underneath chin. Does not hurt. Thinks maybe he stretched jaw too much. When asked about recent illnesses, he said no but then upon further discussion said sinuses had been bothering him a lot. Noted to have 4-5 lb weight loss in 2 months. Says this wasn't intentional but he isn't surprised because his eating habits change frequently.   Health Maintenance Due  Topic Date Due  . OPHTHALMOLOGY EXAM  12/01/1972  . Fecal DNA (Cologuard)  12/01/2012    -  reports that he has never smoked. He has never used smokeless tobacco. - Review of Systems: Per HPI. - Past Medical History: Patient Active Problem List   Diagnosis Date Noted  . Impotence of organic origin 09/26/2013  . Hepatic steatosis 06/01/2012  . Elevated liver function tests 05/18/2012  . Alcohol abuse 05/18/2012  . Type 2 diabetes mellitus without complication ([place]) 99991111  . Hypogonadism male 10/24/2011  . ALCOHOL USE 08/17/2007  . SLEEP APNEA 08/17/2007   - Medications: reviewed and updated   Objective:   Physical Exam BP (!) 164/105   Pulse (!) 112   Temp 98.2 F (36.8 C) (Temporal)   Resp 17   Wt 139 lb 9.6 oz (63.3 kg)   SpO2 99%   BMI 22.53 kg/m  Physical Exam  Constitutional: He is oriented to person, place, and time and well-developed, well-nourished, and in no  distress. No distress.  HENT:  Head: Normocephalic and atraumatic.  Mouth/Throat: Oropharynx is clear and moist. No oropharyngeal exudate.  Small approximately 1 cm mobile submandibular lymph node appreciated. No other lymphadenopathy appreciated in cervical, occipital, or supraclavicular region.   Eyes: Conjunctivae and EOM are normal.  Cardiovascular: Normal rate, regular rhythm and normal heart sounds.  No murmur heard. Pulmonary/Chest: Effort normal and breath sounds normal. No respiratory distress.  Musculoskeletal:        General: Normal range of motion.  Neurological: He is alert and oriented to person, place, and time.  Skin: Skin is warm and dry. He is not diaphoretic.  Psychiatric: Affect and judgment normal.           Assessment & Plan:   1. Type 2 diabetes mellitus without complication, unspecified whether long term insulin use ([facility]) Last A1c well controlled at 6.2. Will recheck in 3 months. Continue Metformin.  - Ambulatory referral to Ophthalmology  2. Blood pressure elevated without history of HTN Patient reports his BP has fluctuated in past, never been told he  has HTN. 147/94 on repeat. Reports he drank 2 redbulls, one coffee, and ate two sausage biscuits prior to appointment. 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 Also endorses feeling nervous.  All of these factors could certainly cause a transient elevation in BP; although would not expect to this degree. Agreeable to obtaining home BP cuff and keeping log. Return in 1-2 months with log for f/u.   3. Erectile dysfunction associated with type 2 diabetes mellitus ([place]) - sildenafil (VIAGRA) 100 MG tablet; Take 1 tablet (100 mg total) by mouth daily as needed.  Dispense: 10 tablet; Refill: 5  4. Submandibular lymphadenopathy Suspect benign etiology given small size, mobile, non-tender, and very limited time course (<7 days) along with history of recent illness. However, given small amount of weight loss  will check CBC. Discussed if growing larger or has not resolved within a month we will need to obtain ultrasound.  - CBC with Differential    Phill Myron, D.O. 06/26/2019, 9:29 AM Primary Care at [facility]

## 2019-06-26 NOTE — Patient Instructions (Addendum)
Blood Pressure Cuff  Please take your blood pressure at home in the morning (might need to be at nighttime after you've slept during the day) prior to drinking any caffeine. Sit with both feet flat on the floor. Rest for at least 2 minutes prior to checking your blood pressure to allow it to normalize after any movement. Write this number down on your log and please bring your log to every office visit. Call the clinic if you have numbers consistently greater than 150 on the top or 100 on the bottom.   If the area on your neck has not gone away in 3-4 weeks or is growing larger, we will need to obtain an ultrasound to evaluate further.

## 2019-06-27 LAB — CBC WITH DIFFERENTIAL/PLATELET
Basophils Absolute: 0 10*3/uL (ref 0.0–0.2)
Basos: 1 %
EOS (ABSOLUTE): 0 10*3/uL (ref 0.0–0.4)
Eos: 1 %
Hematocrit: 42.2 % (ref 37.5–51.0)
Hemoglobin: 14.3 g/dL (ref 13.0–17.7)
Immature Grans (Abs): 0 10*3/uL (ref 0.0–0.1)
Immature Granulocytes: 0 %
Lymphocytes Absolute: 1.2 10*3/uL (ref 0.7–3.1)
Lymphs: 29 %
MCH: 31.5 pg (ref 26.6–33.0)
MCHC: 33.9 g/dL (ref 31.5–35.7)
MCV: 93 fL (ref 79–97)
Monocytes Absolute: 0.4 10*3/uL (ref 0.1–0.9)
Monocytes: 10 %
Neutrophils Absolute: 2.5 10*3/uL (ref 1.4–7.0)
Neutrophils: 59 %
Platelets: 188 10*3/uL (ref 150–450)
RBC: 4.54 x10E6/uL (ref 4.14–5.80)
RDW: 12.6 % (ref 11.6–15.4)
WBC: 4.2 10*3/uL (ref 3.4–10.8)

## 2019-07-11 NOTE — Progress Notes (Signed)
Patient notified of results & recommendations. Expressed understanding.

## 2019-07-23 ENCOUNTER — Encounter: Payer: Self-pay | Admitting: Internal Medicine

## 2019-07-23 ENCOUNTER — Ambulatory Visit (INDEPENDENT_AMBULATORY_CARE_PROVIDER_SITE_OTHER): Payer: Managed Care, Other (non HMO) | Admitting: Internal Medicine

## 2019-07-23 DIAGNOSIS — L089 Local infection of the skin and subcutaneous tissue, unspecified: Secondary | ICD-10-CM

## 2019-07-23 MED ORDER — DOXYCYCLINE HYCLATE 100 MG PO TABS: 100.0000 mg | ORAL_TABLET | Freq: Two times a day (BID) | ORAL | 0 refills | Status: DC

## 2019-07-23 NOTE — Progress Notes (Addendum)
Virtual Visit via Telephone Note  I connected with Todd [name], on 07/23/2019 at 1:34 PM by telephone due to the COVID-19 pandemic and verified that I am speaking with the correct person using two identifiers.   Consent: I discussed the limitations, risks, security and privacy concerns of performing an evaluation and management service by telephone and the availability of in person appointments. I also discussed with the patient that there may be a patient responsible charge related to this service. The patient expressed understanding and agreed to proceed.   Location of Patient: Home   Location of Provider: Clinic    Persons participating in Telemedicine visit: Leverett Heinze [facility] Dr. Juleen China      History of Present Illness: Patient has a visit today for concern about a complication with a zit. Located below his lip. Appeared two weeks ago. He picked at it. He put a Clearasil type product on it to try to get it to heal. When he chews food or talks, it pulls on it. Feels like he is having delayed wound healing. It will drain with pus and blood. He started putting hydrogen peroxide on it to try to keep it clean as well as putting Neosporin on it. The mask irritates it as well. Redness all around his chin. No fevers or vomiting.    Past Medical History:  Diagnosis Date  . Diabetes mellitus without complication ([place])   . Male erectile disorder    No Known Allergies  Current Outpatient Medications on File Prior to Visit  Medication Sig Dispense Refill  . metFORMIN (GLUCOPHAGE-XR) 500 MG 24 hr tablet Take 1 tablet (500 mg total) by mouth 2 (two) times daily. 360 tablet 0  . sildenafil (VIAGRA) 100 MG tablet Take 1 tablet (100 mg total) by mouth daily as needed. 10 tablet 5   No current facility-administered medications on file prior to visit.    Observations/Objective: NAD. Speaking clearly.  Work of breathing normal.  Alert and oriented. Mood appropriate.    Assessment and Plan: 1. Skin infection Concerning for secondary skin infection. Does not seem like its location in the danger triangle zone of the face given below the lip. Will treat with 14 day course of Doxy to cover for potential MRSA. Recommended cleaning with mild antibacterial soap. Could also use topical antibiotic ointment. Instructed to call if not improving or seems to be worsening within the next week.   - doxycycline (VIBRA-TABS) 100 MG tablet; Take 1 tablet (100 mg total) by mouth 2 (two) times daily.  Dispense: 28 tablet; Refill: 0    Follow Up Instructions: Follow up prn    I discussed the assessment and treatment plan with the patient. The patient was provided an opportunity to ask questions and all were answered. The patient agreed with the plan and demonstrated an understanding of the instructions.   The patient was advised to call back or seek an in-person evaluation if the symptoms worsen or if the condition fails to improve as anticipated.     I provided 14 minutes total of non-face-to-face time during this encounter including median intraservice time, reviewing previous notes, investigations, ordering medications, medical decision making, coordinating care and patient verbalized understanding at the end of the visit.    Phill Myron, D.O. Primary Care at [facility]  07/23/2019, 1:34 PM

## 2019-08-06 ENCOUNTER — Other Ambulatory Visit: Payer: Managed Care, Other (non HMO)

## 2019-08-06 ENCOUNTER — Other Ambulatory Visit: Payer: Self-pay

## 2019-08-06 DIAGNOSIS — E119 Type 2 diabetes mellitus without complications: Secondary | ICD-10-CM

## 2019-08-06 NOTE — Progress Notes (Signed)
Patient here for labs ordered during telehealth visit.

## 2019-08-07 LAB — COMPREHENSIVE METABOLIC PANEL
ALT: 49 IU/L — ABNORMAL HIGH (ref 0–44)
AST: 46 IU/L — ABNORMAL HIGH (ref 0–40)
Albumin/Globulin Ratio: 1.3 (ref 1.2–2.2)
Albumin: 4.6 g/dL (ref 3.8–4.9)
Alkaline Phosphatase: 64 IU/L (ref 39–117)
BUN/Creatinine Ratio: 18 (ref 9–20)
BUN: 21 mg/dL (ref 6–24)
Bilirubin Total: 1.1 mg/dL (ref 0.0–1.2)
CO2: 26 mmol/L (ref 20–29)
Calcium: 10.3 mg/dL — ABNORMAL HIGH (ref 8.7–10.2)
Chloride: 99 mmol/L (ref 96–106)
Creatinine, Ser: 1.18 mg/dL (ref 0.76–1.27)
GFR calc Af Amer: 79 mL/min/{1.73_m2} (ref >59)
GFR calc non Af Amer: 69 mL/min/{1.73_m2} (ref >59)
Globulin, Total: 3.5 g/dL (ref 1.5–4.5)
Glucose: 107 mg/dL — ABNORMAL HIGH (ref 65–99)
Potassium: 4.5 mmol/L (ref 3.5–5.2)
Sodium: 140 mmol/L (ref 134–144)
Total Protein: 8.1 g/dL (ref 6.0–8.5)

## 2019-08-07 LAB — HEMOGLOBIN A1C
Est. average glucose Bld gHb Est-mCnc: 123 mg/dL
Hgb A1c MFr Bld: 5.9 % — ABNORMAL HIGH (ref 4.8–5.6)

## 2019-08-14 NOTE — Progress Notes (Signed)
Normal lab letter mailed.

## 2019-08-19 ENCOUNTER — Ambulatory Visit (INDEPENDENT_AMBULATORY_CARE_PROVIDER_SITE_OTHER): Payer: Managed Care, Other (non HMO) | Admitting: Internal Medicine

## 2019-08-19 ENCOUNTER — Encounter: Payer: Self-pay | Admitting: Internal Medicine

## 2019-08-19 DIAGNOSIS — L089 Local infection of the skin and subcutaneous tissue, unspecified: Secondary | ICD-10-CM

## 2019-08-19 MED ORDER — DOXYCYCLINE HYCLATE 100 MG PO TABS: 100.0000 mg | ORAL_TABLET | Freq: Two times a day (BID) | ORAL | 0 refills | Status: DC

## 2019-08-19 NOTE — Progress Notes (Signed)
Virtual Visit via Telephone Note  I connected with Todd [name], on 08/19/2019 at 9:51 AM by telephone due to the COVID-19 pandemic and verified that I am speaking with the correct person using two identifiers.   Consent: I discussed the limitations, risks, security and privacy concerns of performing an evaluation and management service by telephone and the availability of in person appointments. I also discussed with the patient that there may be a patient responsible charge related to this service. The patient expressed understanding and agreed to proceed.   Location of Patient: Home   Location of Provider: Clinic    Persons participating in Telemedicine visit: Damean Kelder [facility] Dr. Juleen China      History of Present Illness: Patient reports that the area on his face has improved after taking Doxycycline. Nothing is draining. It does not hurt. Reports he thinks there is new skin there. It is slightly red in nature. Wants to know what he should be putting on it.   Reports concern about toe infection. Left big toe. Toe will become red, swollen. Hurts after standing all day at work wearing steel toe. Notes some drainage out the side of the toe. No fevers, vomiting. Redness does not extend beyond the toe.    Past Medical History:  Diagnosis Date  . Diabetes mellitus without complication ([place])   . Male erectile disorder    No Known Allergies  Current Outpatient Medications on File Prior to Visit  Medication Sig Dispense Refill  . metFORMIN (GLUCOPHAGE-XR) 500 MG 24 hr tablet Take 1 tablet (500 mg total) by mouth 2 (two) times daily. 360 tablet 0  . sildenafil (VIAGRA) 100 MG tablet Take 1 tablet (100 mg total) by mouth daily as needed. 10 tablet 5   No current facility-administered medications on file prior to visit.    Observations/Objective: NAD. Speaking clearly.  Work of breathing normal.  Alert and oriented. Mood appropriate.   Assessment and Plan: 1. Toe  infection Symptoms concerning for an infection given drainage. Will treat with antibiotic. Would consider gout as well given redness and edema of big toe; however, less likely given report of drainage from the area and that he manually removed a hangnail from the same spot preceding these symptoms.  - doxycycline (VIBRA-TABS) 100 MG tablet; Take 1 tablet (100 mg total) by mouth 2 (two) times daily.  Dispense: 20 tablet; Refill: 0  2. Skin infection Of face. Based on patient history, sounds to be healing well. Advised to use topical emollient such as Vaseline or Aquaphor to protect the area as it heals. Clean with gentle soap.     Follow Up Instructions: In person visit to monitor above concerns    I discussed the assessment and treatment plan with the patient. The patient was provided an opportunity to ask questions and all were answered. The patient agreed with the plan and demonstrated an understanding of the instructions.   The patient was advised to call back or seek an in-person evaluation if the symptoms worsen or if the condition fails to improve as anticipated.     I provided 14 minutes total of non-face-to-face time during this encounter including median intraservice time, reviewing previous notes, investigations, ordering medications, medical decision making, coordinating care and patient verbalized understanding at the end of the visit.    Phill Myron, D.O. Primary Care at [facility]  08/19/2019, 9:51 AM

## 2019-08-29 ENCOUNTER — Ambulatory Visit (INDEPENDENT_AMBULATORY_CARE_PROVIDER_SITE_OTHER): Payer: Managed Care, Other (non HMO) | Admitting: Internal Medicine

## 2019-08-29 ENCOUNTER — Other Ambulatory Visit: Payer: Self-pay

## 2019-08-29 ENCOUNTER — Encounter: Payer: Self-pay | Admitting: Internal Medicine

## 2019-08-29 VITALS — BP 144/82 | HR 92 | Temp 98.2°F | Resp 17 | Wt 138.0 lb

## 2019-08-29 DIAGNOSIS — R600 Localized edema: Secondary | ICD-10-CM | POA: Diagnosis not present

## 2019-08-29 DIAGNOSIS — H6123 Impacted cerumen, bilateral: Secondary | ICD-10-CM | POA: Diagnosis not present

## 2019-08-29 MED ORDER — COLCHICINE 0.6 MG PO TABS: 0.6000 mg | ORAL_TABLET | Freq: Two times a day (BID) | ORAL | 0 refills | Status: DC

## 2019-08-29 MED ORDER — SULFAMETHOXAZOLE-TRIMETHOPRIM 800-160 MG PO TABS: 1.0000 | ORAL_TABLET | Freq: Two times a day (BID) | ORAL | 0 refills | Status: DC

## 2019-08-29 NOTE — Progress Notes (Signed)
  Subjective:    Todd [name] - 59 y.o. male MRN [id number]  Date of birth: 12/16/1960  HPI  Todd [name] is here for concern about his toe. Right great toe has been inflamed x >1 week. Had phone visit with patient on 3/29 for this concern and prescribed Doxycyline. He took antibiotic without improvement or change in toe. Area is painful especially after standing or wearing steel toe boots. He does note occasional drainage from around the toe nail but isn't sure if that's due to picking at an hangnail or if it is actually the neosporin he has been putting on it. He does note he has increased his alcohol intake during the pandemic and has been drinking more beer.   Also wants me to check his ears because they have been bothering him and feeling clogged.     Health Maintenance:  Health Maintenance Due  Topic Date Due  . OPHTHALMOLOGY EXAM  Never done  . Fecal DNA (Cologuard)  Never done    -  reports that he has never smoked. He has never used smokeless tobacco. - Review of Systems: Per HPI. - Past Medical History: Patient Active Problem List   Diagnosis Date Noted  . Impotence of organic origin 09/26/2013  . Hepatic steatosis 06/01/2012  . Elevated liver function tests 05/18/2012  . Alcohol abuse 05/18/2012  . Type 2 diabetes mellitus without complication ([place]) 99991111  . Hypogonadism male 10/24/2011  . ALCOHOL USE 08/17/2007  . SLEEP APNEA 08/17/2007   - Medications: reviewed and updated   Objective:   Physical Exam There were no vitals taken for this visit. Physical Exam  Constitutional: He is oriented to person, place, and time and well-developed, well-nourished, and in no distress. No distress.  HENT:  Bilateral cerumen impaction. No erythema, crusting, drainage of the canals.   Cardiovascular: Normal rate.  Pulmonary/Chest: Effort normal. No respiratory distress.  Musculoskeletal:        General: Normal range of motion.     Comments: Right great toe is diffusely  edematous, warm to the touch, and erythematous. Pain to palpation of the toe. Small amount of drainage present at the edge of the nail bed.   Neurological: He is alert and oriented to person, place, and time.  Skin: Skin is warm and dry. He is not diaphoretic.  Psychiatric: Affect and judgment normal.           Assessment & Plan:   1. Edema of toe Unclear etiology. Could potentially be gout given warm, erythematous joint and increased alcoholic intake over the past year. Will obtain uric acid and start colchicine. Given drainage, concern remains for an infection of the area. Will treat with Bactrim as patient has already completed course of Doxycyline. Have referred to podiatry to see if patient would benefit from nail removal. Patient has a history of T2DM and is at increased risk of skin infections and prolonged wound healing; however, his A1c has been well controlled.  - Uric Acid - colchicine 0.6 MG tablet; Take 1 tablet (0.6 mg total) by mouth 2 (two) times daily.  Dispense: 30 tablet; Refill: 0 - sulfamethoxazole-trimethoprim (BACTRIM DS) 800-160 MG tablet; Take 1 tablet by mouth 2 (two) times daily.  Dispense: 20 tablet; Refill: 0 - Ambulatory referral to Podiatry  2. Impacted cerumen of both ears Removal of cerumen performed by CMA.       Phill Myron, D.O. 08/29/2019, 9:51 AM Primary Care at [facility]

## 2019-08-30 LAB — URIC ACID: Uric Acid: 5.1 mg/dL (ref 3.8–8.4)

## 2019-11-07 ENCOUNTER — Encounter: Payer: Self-pay | Admitting: Internal Medicine

## 2019-11-07 ENCOUNTER — Ambulatory Visit (INDEPENDENT_AMBULATORY_CARE_PROVIDER_SITE_OTHER): Payer: Managed Care, Other (non HMO) | Admitting: Internal Medicine

## 2019-11-07 DIAGNOSIS — E119 Type 2 diabetes mellitus without complications: Secondary | ICD-10-CM | POA: Diagnosis not present

## 2019-11-07 DIAGNOSIS — E1169 Type 2 diabetes mellitus with other specified complication: Secondary | ICD-10-CM | POA: Diagnosis not present

## 2019-11-07 DIAGNOSIS — N521 Erectile dysfunction due to diseases classified elsewhere: Secondary | ICD-10-CM

## 2019-11-07 DIAGNOSIS — R252 Cramp and spasm: Secondary | ICD-10-CM | POA: Diagnosis not present

## 2019-11-07 MED ORDER — METFORMIN HCL ER 500 MG PO TB24: 500.0000 mg | ORAL_TABLET | Freq: Two times a day (BID) | ORAL | 1 refills | Status: DC

## 2019-11-07 MED ORDER — SILDENAFIL CITRATE 100 MG PO TABS: 100.0000 mg | ORAL_TABLET | Freq: Every day | ORAL | 5 refills | Status: DC | PRN

## 2019-11-07 NOTE — Progress Notes (Signed)
Virtual Visit via Telephone Note  I connected with Todd [name], on 11/07/2019 at 9:04 AM by telephone due to the COVID-19 pandemic and verified that I am speaking with the correct person using two identifiers.   Consent: I discussed the limitations, risks, security and privacy concerns of performing an evaluation and management service by telephone and the availability of in person appointments. I also discussed with the patient that there may be a patient responsible charge related to this service. The patient expressed understanding and agreed to proceed.   Location of Patient: Home   Location of Provider: Clinic    Persons participating in Telemedicine visit: [facility] Dr. Juleen China    History of Present Illness: Patient has a visit to follow up on DM.   Diabetes mellitus, Type 2 Disease Monitoring             Blood Sugar Ranges: Does not monitor.              Polyuria: No              Visual problems: no   Urine Microalbumin 7 (Dec 2020)   Last A1C: 5.9 (March 2021)   Medications: Metformin 500 mg BID  Medication Compliance: yes  Medication Side Effects             Hypoglycemia: no    Is also reporting a lot of cramps in his feet and lower legs bilaterally. He is having to get up in the middle of the night to soak his feet. This occurred 2-3 nights in a row. Prior to this, was experiencing it about once per week. Not sure if he's drinking enough water. He is working in a [employer], about 130 degrees, so may be becoming dehydrated.     Past Medical History:  Diagnosis Date  . Diabetes mellitus without complication ([place])   . Male erectile disorder    No Known Allergies  Current Outpatient Medications on File Prior to Visit  Medication Sig Dispense Refill  . colchicine 0.6 MG tablet Take 1 tablet (0.6 mg total) by mouth 2 (two) times daily. 30 tablet 0  . doxycycline (VIBRA-TABS) 100 MG tablet Take 1 tablet (100 mg total) by mouth 2 (two)  times daily. 20 tablet 0  . metFORMIN (GLUCOPHAGE-XR) 500 MG 24 hr tablet Take 1 tablet (500 mg total) by mouth 2 (two) times daily. 360 tablet 0  . sildenafil (VIAGRA) 100 MG tablet Take 1 tablet (100 mg total) by mouth daily as needed. 10 tablet 5   No current facility-administered medications on file prior to visit.    Observations/Objective: NAD. Speaking clearly.  Work of breathing normal.  Alert and oriented. Mood appropriate.   Assessment and Plan: 1. Type 2 diabetes mellitus without complication, unspecified whether long term insulin use (HCC) Last A1c showed great glycemic control. Patient does not monitor CBGs at home. Will repeat A1c and adjust medication as appropriate.  Counseled on Diabetic diet, my plate method, 932 minutes of moderate intensity exercise/week Blood sugar logs with fasting goals of 80-120 mg/dl, random of less than 180 and in the event of sugars less than 60 mg/dl or greater than 400 mg/dl encouraged to notify the clinic. Advised on the need for annual eye exams, annual foot exams, Pneumonia vaccine. - Hemoglobin A1c; Future - metFORMIN (GLUCOPHAGE-XR) 500 MG 24 hr tablet; Take 1 tablet (500 mg total) by mouth 2 (two) times daily.  Dispense: 180 tablet; Refill: 1  2.  Leg cramping Will check electrolytes. Advised adequate hydration as suspect may be related to fluid losses due to work environment.  - Basic metabolic panel; Future  3. Erectile dysfunction associated with type 2 diabetes mellitus (HCC) - sildenafil (VIAGRA) 100 MG tablet; Take 1 tablet (100 mg total) by mouth daily as needed.  Dispense: 10 tablet; Refill: 5   Follow Up Instructions: Lab visit 6/22; 6 month f/u if DM stable    I discussed the assessment and treatment plan with the patient. The patient was provided an opportunity to ask questions and all were answered. The patient agreed with the plan and demonstrated an understanding of the instructions.   The patient was advised to call  back or seek an in-person evaluation if the symptoms worsen or if the condition fails to improve as anticipated.     I provided 16 minutes total of non-face-to-face time during this encounter including median intraservice time, reviewing previous notes, investigations, ordering medications, medical decision making, coordinating care and patient verbalized understanding at the end of the visit.    Phill Myron, D.O. Primary Care at  [facility]  11/07/2019, 9:04 AM

## 2019-11-12 ENCOUNTER — Other Ambulatory Visit: Payer: Self-pay

## 2019-11-12 ENCOUNTER — Other Ambulatory Visit (INDEPENDENT_AMBULATORY_CARE_PROVIDER_SITE_OTHER): Payer: Managed Care, Other (non HMO)

## 2019-11-12 DIAGNOSIS — R252 Cramp and spasm: Secondary | ICD-10-CM

## 2019-11-12 DIAGNOSIS — E119 Type 2 diabetes mellitus without complications: Secondary | ICD-10-CM

## 2019-11-13 LAB — BASIC METABOLIC PANEL
BUN/Creatinine Ratio: 20 (ref 9–20)
BUN: 19 mg/dL (ref 6–24)
CO2: 24 mmol/L (ref 20–29)
Calcium: 9.8 mg/dL (ref 8.7–10.2)
Chloride: 100 mmol/L (ref 96–106)
Creatinine, Ser: 0.95 mg/dL (ref 0.76–1.27)
GFR calc Af Amer: 103 mL/min/{1.73_m2} (ref >59)
GFR calc non Af Amer: 89 mL/min/{1.73_m2} (ref >59)
Glucose: 171 mg/dL — ABNORMAL HIGH (ref 65–99)
Potassium: 4.1 mmol/L (ref 3.5–5.2)
Sodium: 139 mmol/L (ref 134–144)

## 2019-11-13 LAB — HEMOGLOBIN A1C
Est. average glucose Bld gHb Est-mCnc: 126 mg/dL
Hgb A1c MFr Bld: 6 % — ABNORMAL HIGH (ref 4.8–5.6)

## 2019-11-22 NOTE — Progress Notes (Signed)
"  unable to contact" letter mailed.

## 2020-06-30 ENCOUNTER — Other Ambulatory Visit: Payer: Self-pay | Admitting: Internal Medicine

## 2020-06-30 DIAGNOSIS — E1169 Type 2 diabetes mellitus with other specified complication: Secondary | ICD-10-CM

## 2020-07-08 ENCOUNTER — Other Ambulatory Visit: Payer: Self-pay | Admitting: Internal Medicine

## 2020-07-08 DIAGNOSIS — E1169 Type 2 diabetes mellitus with other specified complication: Secondary | ICD-10-CM

## 2020-07-23 ENCOUNTER — Telehealth: Payer: Self-pay | Admitting: Internal Medicine

## 2020-07-23 NOTE — Telephone Encounter (Signed)
1) Medication(s) Requested (by name): sildenafil sildenafil (VIAGRA) 100 MG tablet  2) Pharmacy of Choice: [facility] at [facility] 3) Special Requests:   Approved medications will be sent to the pharmacy, we will reach out if there is an issue.  Requests made after 3pm may not be addressed until the following business day!  If a patient is unsure of the name of the medication(s) please note and ask patient to call back when they are able to provide all info, do not send to responsible party until all information is available!

## 2020-07-23 NOTE — Telephone Encounter (Signed)
This medication has been refused by PCP twice because pt needs appt, no RF will be sent for this until pt comes in for scheduled appt, has not been seen since 10/2019.

## 2020-08-03 ENCOUNTER — Ambulatory Visit (INDEPENDENT_AMBULATORY_CARE_PROVIDER_SITE_OTHER): Payer: Managed Care, Other (non HMO) | Admitting: Family

## 2020-08-03 ENCOUNTER — Encounter: Payer: Self-pay | Admitting: Family

## 2020-08-03 ENCOUNTER — Other Ambulatory Visit: Payer: Self-pay

## 2020-08-03 VITALS — BP 158/94 | HR 90 | Wt 135.8 lb

## 2020-08-03 DIAGNOSIS — N521 Erectile dysfunction due to diseases classified elsewhere: Secondary | ICD-10-CM | POA: Diagnosis not present

## 2020-08-03 DIAGNOSIS — Z0100 Encounter for examination of eyes and vision without abnormal findings: Secondary | ICD-10-CM

## 2020-08-03 DIAGNOSIS — E119 Type 2 diabetes mellitus without complications: Secondary | ICD-10-CM | POA: Diagnosis not present

## 2020-08-03 DIAGNOSIS — E1169 Type 2 diabetes mellitus with other specified complication: Secondary | ICD-10-CM | POA: Diagnosis not present

## 2020-08-03 MED ORDER — SILDENAFIL CITRATE 100 MG PO TABS: 100.0000 mg | ORAL_TABLET | Freq: Every day | ORAL | 5 refills | Status: DC | PRN

## 2020-08-03 MED ORDER — METFORMIN HCL ER 500 MG PO TB24: 500.0000 mg | ORAL_TABLET | Freq: Two times a day (BID) | ORAL | 0 refills | Status: DC

## 2020-08-03 NOTE — Progress Notes (Signed)
Patient ID: Todd [name], male    DOB: May 06, 1961  MRN: [id number]  CC: Diabetes Follow-Up  Subjective: Todd [name] is a 60 y.o. male who presents for diabetes follow-up. His concerns today include:   1. DIABETES TYPE 2 FOLLOW-UP: 11/07/2019 per DO note: Last A1c showed great glycemic control. Patient does not monitor CBGs at home. Will repeat A1c and adjust medication as appropriate. continued on Metformin.  08/03/2020: Last A1C: 6.0% on 11/12/2019   Med Adherence:  []  Yes    [x]  No, reports since his A1c improved on his last visit sometimes he will take 1 tablet daily and other days he will take 2 tablets daily  Medication side effects:  []  Yes    [x]  No Home Monitoring?  []  Yes    [x]  No Diet Adherence: Trying to improve Exercise:at work Last eye exam: over 1 year  2. VIAGRA: Requesting refills.  Patient Active Problem List   Diagnosis Date Noted  . Impotence of organic origin 09/26/2013  . Hepatic steatosis 06/01/2012  . Elevated liver function tests 05/18/2012  . Alcohol abuse 05/18/2012  . Type 2 diabetes mellitus without complication ([place]) 08/65/7846  . Hypogonadism male 10/24/2011  . ALCOHOL USE 08/17/2007  . SLEEP APNEA 08/17/2007     No current outpatient medications on file prior to visit.   No current facility-administered medications on file prior to visit.    No Known Allergies  Social History   Socioeconomic History  . Marital status: Single    Spouse name: Not on file  . Number of children: Not on file  . Years of education: Not on file  . Highest education level: Not on file  Occupational History  . Not on file  Tobacco Use  . Smoking status: Never Smoker  . Smokeless tobacco: Never Used  Substance and Sexual Activity  . Alcohol use: Yes  . Drug use: No  . Sexual activity: Yes    Birth control/protection: None    Comment: number of sex partners in the last 25 months 4  Other Topics Concern  . Not on file  Social History Narrative   . Not on file   Social Determinants of Health   Financial Resource Strain: Not on file  Food Insecurity: Not on file  Transportation Needs: Not on file  Physical Activity: Not on file  Stress: Not on file  Social Connections: Not on file  Intimate Partner Violence: Not on file    Family History  Problem Relation Age of Onset  . Diabetes Father   . Diabetes Brother     Past Surgical History:  Procedure Laterality Date  . NO PAST SURGERIES      ROS: Review of Systems Negative except as stated above  PHYSICAL EXAM: BP (!) 158/94 (BP Location: Left Arm, Patient Position: Sitting)   Pulse 90   Wt 135 lb 12.8 oz (61.6 kg)   SpO2 95%   BMI 21.92 kg/m   Physical Exam HENT:     Head: Normocephalic and atraumatic.  Eyes:     Conjunctiva/sclera: Conjunctivae normal.     Pupils: Pupils are equal, round, and reactive to light.  Cardiovascular:     Rate and Rhythm: Normal rate and regular rhythm.     Pulses: Normal pulses.     Heart sounds: Normal heart sounds.  Pulmonary:     Effort: Pulmonary effort is normal.     Breath sounds: Normal breath sounds.  Musculoskeletal:     Cervical back:  Normal range of motion and neck supple.  Neurological:     General: No focal deficit present.     Mental Status: He is alert and oriented to person, place, and time.  Psychiatric:        Mood and Affect: Mood normal.        Behavior: Behavior normal.     ASSESSMENT AND PLAN: 1. Type 2 diabetes mellitus without complication, unspecified whether long term insulin use ([place]): - Previous hemoglobin A1c at goal at 6.0% on 11/12/2019, goal < 7%. Will repeat hemoglobin A1c during today's visit.  - Continue Metformin as prescribed.  - Discussed the importance of healthy eating habits, low-carbohydrate diet, low-sugar diet, regular aerobic exercise (at least 150 minutes a week as tolerated) and medication compliance to achieve or maintain control of diabetes. - BMP to check kidney function  and electrolyte balance.  - Follow-up with primary provider as scheduled.  - Hemoglobin L4T - Basic Metabolic Panel - metFORMIN (GLUCOPHAGE-XR) 500 MG 24 hr tablet; Take 1 tablet (500 mg total) by mouth 2 (two) times daily.  Dispense: 180 tablet; Refill: 0  2. Diabetic eye exam [facility]): - Referral to Ophthalmology for further evaluation and management. - Ambulatory referral to Ophthalmology  3. Erectile dysfunction associated with type 2 diabetes mellitus ([place]): - Continue Sildenafil as prescribed. - Counseled against using this with nitrates and alcohol consumption.  - Follow-up with primary provider as scheduled.  - sildenafil (VIAGRA) 100 MG tablet; Take 1 tablet (100 mg total) by mouth daily as needed.  Dispense: 10 tablet; Refill: 5   Patient was given the opportunity to ask questions.  Patient verbalized understanding of the plan and was able to repeat key elements of the plan. Patient was given clear instructions to go to Emergency Department or return to medical center if symptoms don't improve, worsen, or new problems develop.The patient verbalized understanding.   Orders Placed This Encounter  Procedures  . Hemoglobin A1c  . Basic Metabolic Panel  . Ambulatory referral to Ophthalmology     Requested Prescriptions   Signed Prescriptions Disp Refills  . sildenafil (VIAGRA) 100 MG tablet 10 tablet 5    Sig: Take 1 tablet (100 mg total) by mouth daily as needed.  . metFORMIN (GLUCOPHAGE-XR) 500 MG 24 hr tablet 180 tablet 0    Sig: Take 1 tablet (500 mg total) by mouth 2 (two) times daily.    Return in about 3 months (around 11/03/2020) for Dr., Juleen China Follow-Up diabetes .  Camillia Herter, NP

## 2020-08-03 NOTE — Patient Instructions (Addendum)
Metformin for diabetes.  Sildenafil for erectile dysfunction.   Follow-up with primary physician in 3 months or sooner if needed.  Type 2 Diabetes Mellitus, Diagnosis, Adult Type 2 diabetes (type 2 diabetes mellitus) is a long-term disease. It may happen when there is one or both of these problems:  The pancreas does not make enough insulin.  The body does not react in a normal way to insulin that it makes. Insulin lets sugars go into cells in your body. If you have type 2 diabetes, sugars cannot get into your cells. Sugars build up in the blood. This causes high blood sugar. What are the causes? The exact cause of this condition is not known. What increases the risk? The following factors may make you more likely to develop this condition:  Having type 2 diabetes in your family.  Being overweight or very overweight.  Not being active.  Your body not reacting in a normal way to the insulin it makes.  Having higher than normal blood sugar over time.  Having a type of diabetes when you were pregnant.  Having a condition that causes small fluid-filled sacs on your ovaries. What are the signs or symptoms? At first, you may have no symptoms. You will get symptoms slowly. They may include:  More thirst than normal.  More hunger than normal.  Needing to pee more than normal.  Losing weight without trying.  Feeling tired.  Feeling weak.  Seeing things blurry.  Dark patches on your skin. How is this treated? This condition may be treated by a diabetes expert. You may need to:  Follow an eating plan made by a food expert (dietitian).  Get regular exercise.  Find ways to deal with stress.  Check blood sugar as often as told.  Take medicines. Your doctor will set treatment goals for you. Your blood sugar should be at these levels:  Before meals: 80-130 mg/dL (4.4-7.2 mmol/L).  After meals: below 180 mg/dL (10 mmol/L).  Over the last 2-3 months: less than  7%. Follow these instructions at home: Medicines  Take your diabetes medicines or insulin every day.  Take medicines to help you not get other problems caused by this condition. You may need: ? Aspirin. ? Medicine to lower cholesterol. ? Medicine to control blood pressure. Questions to ask your doctor  Should I meet with a diabetes educator?  What medicines do I need, and when should I take them?  What will I need to treat my condition at home?  When should I check my blood sugar?  Where can I find a support group?  Who can I call if I have questions?  When is my next doctor visit? General instructions  Take over-the-counter and prescription medicines only as told by your doctor.  Keep all follow-up visits as told by your doctor. This is important. Where to find more information  American Diabetes Association (ADA): www.diabetes.org  American Association of Diabetes Care and Education Specialists (ADCES): www.diabeteseducator.org  International Diabetes Federation (IDF): MemberVerification.ca Contact a doctor if:  Your blood sugar is at or above 240 mg/dL (13.3 mmol/L) for 2 days in a row.  You have been sick for 2 days or more, and you are not getting better.  You have had a fever for 2 days or more, and you are not getting better.  You have any of these problems for more than 6 hours: ? You cannot eat or drink. ? You feel like you may vomit. ? You vomit. ?  You have watery poop (diarrhea). Get help right away if:  Your blood sugar is very low. This means it is lower than 54 mg/dL (3 mmol/L).  You feel mixed up (confused).  You have trouble thinking clearly.  You have trouble breathing.  You have medium or large ketone levels in your pee. These symptoms may be an emergency. Do not wait to see if the symptoms will go away. Get medical help right away. Call your local emergency services (911 in the [place] Do not drive yourself to the hospital. Summary  Type 2  diabetes is a long-term disease. Your pancreas may not make enough insulin, or your body may not react in a normal way to insulin that it makes.  This condition is treated with an eating plan, lifestyle changes, and medicines.  Your doctor will set treatment goals for you. These will help you keep your blood sugar in a healthy range.  Keep all follow-up visits as told by your doctor. This is important. This information is not intended to replace advice given to you by your health care provider. Make sure you discuss any questions you have with your health care provider. Document Revised: 12/04/2019 Document Reviewed: 12/04/2019 Elsevier Patient Education  [place].

## 2020-08-03 NOTE — Progress Notes (Signed)
Diabetes  Needs metformin and sildenafil refill

## 2020-08-12 LAB — BASIC METABOLIC PANEL
BUN/Creatinine Ratio: 25 — ABNORMAL HIGH (ref 9–20)
BUN: 22 mg/dL (ref 6–24)
CO2: 20 mmol/L (ref 20–29)
Calcium: 9.5 mg/dL (ref 8.7–10.2)
Chloride: 102 mmol/L (ref 96–106)
Creatinine, Ser: 0.87 mg/dL (ref 0.76–1.27)
Glucose: 94 mg/dL (ref 65–99)
Potassium: 4.1 mmol/L (ref 3.5–5.2)
Sodium: 139 mmol/L (ref 134–144)
eGFR: 101 mL/min/{1.73_m2} (ref >59)

## 2020-08-12 LAB — HEMOGLOBIN A1C
Est. average glucose Bld gHb Est-mCnc: 131 mg/dL
Hgb A1c MFr Bld: 6.2 % — ABNORMAL HIGH (ref 4.8–5.6)

## 2020-08-12 NOTE — Progress Notes (Signed)
Diabetes controlled, continue regimen.  Kidney function normal.

## 2020-12-22 ENCOUNTER — Encounter: Payer: Self-pay | Admitting: Physician Assistant

## 2020-12-22 ENCOUNTER — Other Ambulatory Visit (HOSPITAL_COMMUNITY)
Admission: RE | Admit: 2020-12-22 | Discharge: 2020-12-22 | Disposition: A | Discharge status: Home / Self Care | Payer: BC Managed Care – PPO | Source: Ambulatory Visit | Attending: Physician Assistant | Admitting: Physician Assistant

## 2020-12-22 ENCOUNTER — Ambulatory Visit (INDEPENDENT_AMBULATORY_CARE_PROVIDER_SITE_OTHER): Payer: BC Managed Care – PPO | Admitting: Physician Assistant

## 2020-12-22 ENCOUNTER — Other Ambulatory Visit: Payer: Self-pay

## 2020-12-22 VITALS — BP 128/75 | HR 85 | Temp 98.2°F | Resp 18 | Ht 68.0 in | Wt 134.0 lb

## 2020-12-22 DIAGNOSIS — Z202 Contact with and (suspected) exposure to infections with a predominantly sexual mode of transmission: Secondary | ICD-10-CM | POA: Diagnosis not present

## 2020-12-22 DIAGNOSIS — N451 Epididymitis: Secondary | ICD-10-CM | POA: Insufficient documentation

## 2020-12-22 LAB — POCT URINALYSIS DIP (CLINITEK)
Bilirubin, UA: NEGATIVE
Glucose, UA: NEGATIVE mg/dL
Ketones, POC UA: NEGATIVE mg/dL
Leukocytes, UA: NEGATIVE
Nitrite, UA: NEGATIVE
POC PROTEIN,UA: NEGATIVE
Spec Grav, UA: >=1.03 — AB (ref 1.010–1.025)
Urobilinogen, UA: 0.2 E.U./dL
pH, UA: 6 (ref 5.0–8.0)

## 2020-12-22 MED ORDER — CEFTRIAXONE SODIUM 500 MG IJ SOLR
500.0000 mg | Freq: Once | INTRAMUSCULAR | Status: AC
Start: 2020-12-22 — End: 2020-12-22
  Administered 2020-12-22: 500 mg via INTRAMUSCULAR

## 2020-12-22 MED ORDER — DOXYCYCLINE HYCLATE 100 MG PO TABS: 100.0000 mg | ORAL_TABLET | Freq: Two times a day (BID) | ORAL | 0 refills | Status: AC

## 2020-12-22 NOTE — Progress Notes (Signed)
Established Patient Office Visit  Subjective:  Patient ID: Todd [name], male    DOB: Oct 30, 1960  Age: 60 y.o. MRN: [id number]  CC:  Chief Complaint  Patient presents with   Testicle Pain    Right    HPI Todd [name] states that he started having swelling and pain in his right testicle earlier today, states that it is painful to touch and worse with movement. Has not tried anything for relief.  Endorses that he did have unprotected intercourse a couple of days ago.  States that his partner told him she had a staph infection.  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  No pain with urination, no fever, n/v    Past Medical History:  Diagnosis Date   Diabetes mellitus without complication [facility])    Male erectile disorder     Past Surgical History:  Procedure Laterality Date   NO PAST SURGERIES      Family History  Problem Relation Age of Onset   Diabetes Father    Diabetes Brother     Social History   Socioeconomic History   Marital status: Single    Spouse name: Not on file   Number of children: Not on file   Years of education: Not on file   Highest education level: Not on file  Occupational History   Not on file  Tobacco Use   Smoking status: Never   Smokeless tobacco: Never  Substance and Sexual Activity   Alcohol use: Yes   Drug use: No   Sexual activity: Yes    Birth control/protection: None    Comment: number of sex partners in the last 12 months 4  Other Topics Concern   Not on file  Social History Narrative   Not on file   Social Determinants of Health   Financial Resource Strain: Not on file  Food Insecurity: Not on file  Transportation Needs: Not on file  Physical Activity: Not on file  Stress: Not on file  Social  Connections: Not on file  Intimate Partner Violence: Not on file    Outpatient Medications Prior to Visit  Medication Sig Dispense Refill   metFORMIN (GLUCOPHAGE-XR) 500 MG 24 hr tablet Take 1 tablet (500 mg total) by mouth 2 (two) times daily. 180 tablet 0   sildenafil (VIAGRA) 100 MG tablet Take 1 tablet (100 mg total) by mouth daily as needed. 10 tablet 5   No facility-administered medications prior to visit.    No Known Allergies  ROS Review of Systems  Constitutional:  Negative for chills and fever.  HENT: Negative.    Eyes: Negative.   Respiratory:  Negative for shortness of breath.   Cardiovascular:  Negative for chest pain.  Gastrointestinal: Negative.   Endocrine: Negative.   Genitourinary:  Positive for scrotal swelling and testicular pain. Negative for dysuria, genital sores, hematuria, penile discharge and penile swelling.  Musculoskeletal: Negative.      Objective:    Physical Exam Vitals and nursing note reviewed. Exam conducted with a chaperone present.  Constitutional:      Appearance: Normal appearance.  HENT:     Head: Normocephalic and atraumatic.     Right Ear: External ear normal.     Left Ear: External  ear normal.     Nose: Nose normal.     Mouth/Throat:     Mouth: Mucous membranes are moist.     Pharynx: Oropharynx is clear.  Eyes:     Extraocular Movements: Extraocular movements intact.     Conjunctiva/sclera: Conjunctivae normal.     Pupils: Pupils are equal, round, and reactive to light.  Cardiovascular:     Rate and Rhythm: Normal rate and regular rhythm.     Pulses: Normal pulses.     Heart sounds: Normal heart sounds.  Pulmonary:     Effort: Pulmonary effort is normal.     Breath sounds: Normal breath sounds.  Abdominal:     Tenderness: There is no abdominal tenderness. There is no right CVA tenderness or left CVA tenderness.  Genitourinary:    Penis: Normal and circumcised.      Testes:        Right: Tenderness and swelling  present.     Epididymis:     Right: Inflamed. Tenderness present.  Musculoskeletal:        General: Normal range of motion.     Cervical back: Normal range of motion.  Skin:    General: Skin is warm and dry.  Neurological:     General: No focal deficit present.     Mental Status: He is alert and oriented to person, place, and time.  Psychiatric:        Mood and Affect: Mood normal.        Behavior: Behavior normal.        Thought Content: Thought content normal.        Judgment: Judgment normal.    BP 128/75 (BP Location: Right Arm, Patient Position: Sitting, Cuff Size: Normal)   Pulse 85   Temp 98.2 F (36.8 C) (Temporal)   Resp 18   Ht _0  (1.727 m)   Wt 134 lb (60.8 kg)   SpO2 97%   BMI 20.37 kg/m  Wt Readings from Last 3 Encounters:  12/22/20 134 lb (60.8 kg)  08/03/20 135 lb 12.8 oz (61.6 kg)  08/29/19 138 lb (62.6 kg)     Health Maintenance Due  Topic Date Due   PNEUMOCOCCAL POLYSACCHARIDE VACCINE AGE 8-64 HIGH RISK  Never done   COVID-19 Vaccine (1) Never done   Pneumococcal Vaccine 73-24 Years old (1 - PCV) Never done   OPHTHALMOLOGY EXAM  Never done   Fecal DNA (Cologuard)  Never done   Zoster Vaccines- Shingrix (1 of 2) Never done   FOOT EXAM  05/05/2020   URINE MICROALBUMIN  05/05/2020   INFLUENZA VACCINE  12/21/2020    There are no preventive care reminders to display for this patient.  Lab Results  Component Value Date   TSH 1.078 10/24/2011   Lab Results  Component Value Date   WBC 4.2 06/26/2019   HGB 14.3 06/26/2019   HCT 42.2 06/26/2019   MCV 93 06/26/2019   PLT 188 06/26/2019   Lab Results  Component Value Date   NA 139 08/11/2020   K 4.1 08/11/2020   CO2 20 08/11/2020   GLUCOSE 94 08/11/2020   BUN 22 08/11/2020   CREATININE 0.87 08/11/2020   BILITOT 1.1 08/06/2019   ALKPHOS 64 08/06/2019   AST 46 (H) 08/06/2019   ALT 49 (H) 08/06/2019   PROT 8.1 08/06/2019   ALBUMIN 4.6 08/06/2019   CALCIUM 9.5 08/11/2020   EGFR 101  08/11/2020   Lab Results  Component Value Date   CHOL  171 05/06/2019   Lab Results  Component Value Date   HDL 57 05/06/2019   Lab Results  Component Value Date   LDLCALC 99 05/06/2019   Lab Results  Component Value Date   TRIG 78 05/06/2019   Lab Results  Component Value Date   CHOLHDL 3.0 05/06/2019   Lab Results  Component Value Date   HGBA1C 6.2 (H) 08/11/2020      Assessment & Plan:   Problem List Items Addressed This Visit       Genitourinary   Epididymitis - Primary   Relevant Medications   doxycycline (VIBRA-TABS) 100 MG tablet   Other Relevant Orders   POCT URINALYSIS DIP (CLINITEK) (Completed)   Urine cytology ancillary only    Meds ordered this encounter  Medications   doxycycline (VIBRA-TABS) 100 MG tablet    Sig: Take 1 tablet (100 mg total) by mouth 2 (two) times daily for 10 days.    Dispense:  20 tablet    Refill:  0    Order Specific Question:   Supervising Provider    Answer:   Asencion Noble E [[id number]]   cefTRIAXone (ROCEPHIN) injection 500 mg   1. Epididymitis Rocephin injection given in clinic, trial doxycycline.  Patient education given on supportive care, red flags given for prompt reevaluation. - doxycycline (VIBRA-TABS) 100 MG tablet; Take 1 tablet (100 mg total) by mouth 2 (two) times daily for 10 days.  Dispense: 20 tablet; Refill: 0 - POCT URINALYSIS DIP (CLINITEK) - cefTRIAXone (ROCEPHIN) injection 500 mg - Urine cytology ancillary only   I have reviewed the patient's medical history (PMH, PSH, Social History, Family History, Medications, and allergies) , and have been updated if relevant. I spent 30 minutes reviewing chart and  face to face time with patient.    Follow-up: Return if symptoms worsen or fail to improve.    Loraine Grip Mayers, PA-C

## 2020-12-22 NOTE — Progress Notes (Signed)
Patient forgot to take metformin today but shares he takes it daily. Patient has eaten today. Patient reports waking up to an aching right testicle. Patient reports pain increasing with movement while at work and becoming swollen and hot to touch. Patient reports L testicle being fake and worried about the R.

## 2020-12-22 NOTE — Patient Instructions (Addendum)
You were given an injection of Rocephin today.  You will take doxycycline twice a day for the next 10 days.  We will call you with today's lab results.  I encourage you to use ibuprofen to help with the pain.  Kennieth Rad, PA-C Physician Assistant [facility] [email or web address]   Epididymitis  Epididymitis is swelling (inflammation) or infection of the epididymis. The epididymis is a cord-like structure that is located along the top and back part of the testicle. It collects and storessperm from the testicle. This condition can also cause pain and swelling of the testicle and scrotum. Symptoms usually start suddenly (acute epididymitis). Sometimes epididymitis starts gradually and lasts for a while (chronic epididymitis). This type may be harder to treat. What are the causes? In men ages 82-40, this condition is usually caused by a bacterial infection or a sexually transmitted disease (STD), such as: Gonorrhea. Chlamydia. In men 1 and older who do not have anal sex, this condition is usually caused by bacteria from a blockage or from abnormalities in the urinary system. These can result from: Having a tube placed into the bladder (urinary catheter). Having an enlarged or inflamed prostate gland. Having recently had urinary tract surgery. Having a problem with a backward flow of urine (retrograde). In men who have a condition that weakens the body's defense system (immune system), such as HIV, this condition can be caused by: Other bacteria, including tuberculosis and syphilis. Viruses. Fungi. Sometimes this condition occurs without infection. This may happen because oftrauma or repetitive activities such as sports. What increases the risk? You are more likely to develop this condition if you have: Unprotected sex with more than one partner. Anal sex. Recently had surgery. A urinary catheter. Urinary problems. A suppressed  immune system. What are the signs or symptoms? This condition usually begins suddenly with chills, fever, and pain behind the scrotum and in the testicle. Other symptoms include: Swelling of the scrotum, testicle, or both. Pain when ejaculating or urinating. Pain in the back or abdomen. Nausea. Itching and discharge from the penis. A frequent need to pass urine. Redness, increased warmth, and tenderness of the scrotum. How is this diagnosed? Your health care provider can diagnose this condition based on your symptoms and medical history. Your health care provider will also do a physical exam to ask about your symptoms and check your scrotum and testicle for swelling, pain, and redness. You may also have other tests, including: Examination of discharge from the penis. Urine tests for infections, such as STDs. Ultrasound test for blood flow and inflammation. Your health care provider may test you for other STDs, including HIV. How is this treated? Treatment for this condition depends on the cause. If your condition is caused by a bacterial infection, oral antibiotic medicine may be prescribed. If the bacterial infection has spread to your blood, you may need to receive IVantibiotics. For both bacterial and nonbacterial epididymitis, you may be treated with: Rest. Elevation of the scrotum. Pain medicines. Anti-inflammatory medicines. Surgery may be needed to treat: Bacterial epididymitis that causes pus to build up in the scrotum (abscess). Chronic epididymitis that has not responded to other treatments. Follow these instructions at home: Medicines Take over-the-counter and prescription medicines only as told by your health care provider. If you were prescribed an antibiotic medicine, take it as told by your health care provider. Do not stop taking the antibiotic even if your condition improves. Sexual activity If your epididymitis was caused by an STD, avoid  sexual activity until your  treatment is complete. Inform your sexual partner or partners if you test positive for an STD. They may need to be treated. Do not engage in sexual activity with your partner or partners until their treatment is completed. Managing pain and swelling  If directed, elevate your scrotum and apply ice. Put ice in a plastic bag. Place a small towel or pillow between your legs. Rest your scrotum on the pillow or towel. Place another towel between your skin and the plastic bag. Leave the ice on for 20 minutes, 2-3 times a day. Try taking a sitz bath to help with discomfort. This is a warm water bath that is taken while you are sitting down. The water should only come up to your hips and should cover your buttocks. Do this 3-4 times per day or as told by your health care provider. Keep your scrotum elevated and supported while resting. Ask your health care provider if you should wear a scrotal support, such as a jockstrap. Wear it as told by your health care provider.  General instructions Return to your normal activities as told by your health care provider. Ask your health care provider what activities are safe for you. Drink enough fluid to keep your urine pale yellow. Keep all follow-up visits as told by your health care provider. This is important. Contact a health care provider if: You have a fever. Your pain medicine is not helping. Your pain is getting worse. Your symptoms do not improve within 3 days. Summary Epididymitis is swelling (inflammation) or infection of the epididymis. This condition can also cause pain and swelling of the testicle and scrotum. Treatment for this condition depends on the cause. If your condition is caused by a bacterial infection, oral antibiotic medicine may be prescribed. Inform your sexual partner or partners if you test positive for an STD. They may need to be treated. Do not engage in sexual activity with your partner or partners until their treatment is  completed. Contact a health care provider if your symptoms do not improve within 3 days. This information is not intended to replace advice given to you by your health care provider. Make sure you discuss any questions you have with your healthcare provider. Document Revised: 03/12/2018 Document Reviewed: 03/13/2018 Elsevier Patient Education  2022 [employer].

## 2020-12-23 LAB — URINE CYTOLOGY ANCILLARY ONLY
Candida Urine: NEGATIVE
Chlamydia: NEGATIVE
Comment: NEGATIVE
Comment: NEGATIVE
Comment: NORMAL
Neisseria Gonorrhea: NEGATIVE
Trichomonas: NEGATIVE

## 2021-03-12 ENCOUNTER — Other Ambulatory Visit: Payer: Self-pay | Admitting: Family

## 2021-03-12 DIAGNOSIS — E1169 Type 2 diabetes mellitus with other specified complication: Secondary | ICD-10-CM

## 2021-03-12 DIAGNOSIS — N521 Erectile dysfunction due to diseases classified elsewhere: Secondary | ICD-10-CM

## 2021-03-15 ENCOUNTER — Other Ambulatory Visit: Payer: Self-pay | Admitting: Family

## 2021-03-15 DIAGNOSIS — E1169 Type 2 diabetes mellitus with other specified complication: Secondary | ICD-10-CM

## 2021-03-15 DIAGNOSIS — N521 Erectile dysfunction due to diseases classified elsewhere: Secondary | ICD-10-CM

## 2021-03-25 ENCOUNTER — Ambulatory Visit: Payer: BC Managed Care – PPO | Admitting: Family Medicine

## 2021-03-25 ENCOUNTER — Other Ambulatory Visit: Payer: Self-pay

## 2021-03-25 ENCOUNTER — Encounter: Payer: Self-pay | Admitting: Family Medicine

## 2021-03-25 VITALS — BP 121/76 | HR 87 | Temp 98.0°F | Resp 16 | Wt 130.8 lb

## 2021-03-25 DIAGNOSIS — N529 Male erectile dysfunction, unspecified: Secondary | ICD-10-CM

## 2021-03-25 DIAGNOSIS — M79642 Pain in left hand: Secondary | ICD-10-CM | POA: Diagnosis not present

## 2021-03-25 DIAGNOSIS — M79641 Pain in right hand: Secondary | ICD-10-CM | POA: Diagnosis not present

## 2021-03-25 DIAGNOSIS — E119 Type 2 diabetes mellitus without complications: Secondary | ICD-10-CM | POA: Diagnosis not present

## 2021-03-25 DIAGNOSIS — Z23 Encounter for immunization: Secondary | ICD-10-CM

## 2021-03-25 LAB — POCT GLYCOSYLATED HEMOGLOBIN (HGB A1C): Hemoglobin A1C: 6.3 % — AB (ref 4.0–5.6)

## 2021-03-25 MED ORDER — TADALAFIL 20 MG PO TABS
10.0000 mg | ORAL_TABLET | ORAL | 2 refills | Status: DC | PRN
Start: 2021-03-25 — End: 2022-05-10

## 2021-03-25 MED ORDER — MELOXICAM 7.5 MG PO TABS: 7.5000 mg | ORAL_TABLET | Freq: Every day | ORAL | 0 refills | Status: AC

## 2021-03-25 MED ORDER — TRIAMCINOLONE ACETONIDE 40 MG/ML IJ SUSP
40.0000 mg | Freq: Once | INTRAMUSCULAR | Status: AC
  Administered 2021-03-25: 40 mg via INTRAMUSCULAR

## 2021-03-25 NOTE — Progress Notes (Signed)
Patient wants cilias 20 mg instead of Sildenafil.

## 2021-03-25 NOTE — Progress Notes (Signed)
Established Patient Office Visit  Subjective:  Patient ID: Todd [name], male    DOB: April 16, 1961  Age: 60 y.o. MRN: [id number]  CC:  Chief Complaint  Patient presents with   Follow-up   Medication Refill   Diabetes    HPI Todd [name] presents for for follow-up of diabetes.  Patient also is on Viagra for ED and would like to be switched to Cialis.  Lastly patient complains of bilateral hand pain.  This has been increasing over the last couple of weeks.  Patient reports that he is having difficulty at work and at home with activities secondary to his symptoms.  Has taken no meds for his symptoms.  He denies known injury or trauma.  Past Medical History:  Diagnosis Date   Diabetes mellitus without complication [facility])    Male erectile disorder       Social History   Socioeconomic History   Marital status: Single    Spouse name: Not on file   Number of children: Not on file   Years of education: Not on file   Highest education level: Not on file  Occupational History   Not on file  Tobacco Use   Smoking status: Never   Smokeless tobacco: Never  Substance and Sexual Activity   Alcohol use: Yes   Drug use: No   Sexual activity: Yes    Birth control/protection: None    Comment: number of sex partners in the last 12 months 4  Other Topics Concern   Not on file  Social History Narrative   Not on file   Social Determinants of Health   Financial Resource Strain: Not on file  Food Insecurity: Not on file  Transportation Needs: Not on file  Physical Activity: Not on file  Stress: Not on file  Social Connections: Not on file  Intimate Partner Violence: Not on file    ROS Review of Systems  Constitutional:  Negative for fever.  Musculoskeletal:  Positive for arthralgias and joint swelling.  All other systems reviewed and are negative.  Objective:   Today's Vitals: BP 121/76   Pulse 87   Temp 98 F (36.7 C) (Oral)   Resp 16   Wt 130 lb 12.8 oz (59.3 kg)    SpO2 97%   BMI 19.89 kg/m   Physical Exam Vitals and nursing note reviewed.  Constitutional:      General: He is not in acute distress. Cardiovascular:     Rate and Rhythm: Normal rate and regular rhythm.  Pulmonary:     Effort: Pulmonary effort is normal.     Breath sounds: Normal breath sounds.  Abdominal:     Palpations: Abdomen is soft.     Tenderness: There is no abdominal tenderness.  Musculoskeletal:     Right hand: Swelling, deformity and tenderness present. Decreased range of motion.     Left hand: Swelling, deformity and tenderness present. Decreased range of motion.     Right lower leg: No edema.     Left lower leg: No edema.  Neurological:     General: No focal deficit present.     Mental Status: He is alert and oriented to person, place, and time.    Assessment & Plan:   1. Type 2 diabetes mellitus without complication, unspecified whether long term insulin use (HCC) A1c is at goal.  Continue present management and monitor. - POCT glycosylated hemoglobin (Hb A1C)  2. Bilateral hand pain Arthritis panel labs will be obtained.  Kenalog IM injection given.  Tylenol/NSAIDs to be utilized as needed. - ANA - Sedimentation Rate - Rheumatoid factor - triamcinolone acetonide (KENALOG-40) injection 40 mg  3. Erectile dysfunction, unspecified erectile dysfunction type Patient was preferentially switched from Viagra to Cialis 20 mg tablets. - tadalafil (CIALIS) 20 MG tablet; Take 0.5-1 tablets (10-20 mg total) by mouth every other day as needed for erectile dysfunction.  Dispense: 10 tablet; Refill: 2  4. Need for influenza vaccination  - POCT glycosylated hemoglobin (Hb A1C)    Outpatient Encounter Medications as of 03/25/2021  Medication Sig   metFORMIN (GLUCOPHAGE-XR) 500 MG 24 hr tablet Take 1 tablet (500 mg total) by mouth 2 (two) times daily.   sildenafil (VIAGRA) 100 MG tablet Take 1 tablet (100 mg total) by mouth daily as needed.   No  facility-administered encounter medications on file as of 03/25/2021.    Follow-up: No follow-ups on file.   Becky Sax, MD

## 2021-03-26 LAB — SEDIMENTATION RATE: Sed Rate: 5 mm/hr (ref 0–30)

## 2021-03-26 LAB — RHEUMATOID FACTOR: Rheumatoid fact SerPl-aCnc: <10 IU/mL (ref <14.0)

## 2021-03-26 LAB — ANA: Anti Nuclear Antibody (ANA): NEGATIVE

## 2021-03-30 ENCOUNTER — Other Ambulatory Visit (INDEPENDENT_AMBULATORY_CARE_PROVIDER_SITE_OTHER): Payer: BC Managed Care – PPO | Admitting: *Deleted

## 2021-03-30 ENCOUNTER — Telehealth: Payer: Self-pay

## 2021-03-30 DIAGNOSIS — Z23 Encounter for immunization: Secondary | ICD-10-CM

## 2021-03-30 NOTE — Telephone Encounter (Signed)
Pt called in stating the pain in his hand is worsening, he is requesting pain medication for this issue. Pt was last seen on 03/25/21

## 2021-03-31 NOTE — Telephone Encounter (Signed)
Pt returned phone call, advised pt we would return his call tomorrow or Friday

## 2021-03-31 NOTE — Telephone Encounter (Signed)
Patient was called and there was no answer.

## 2021-04-01 ENCOUNTER — Other Ambulatory Visit: Payer: Self-pay | Admitting: Family Medicine

## 2021-04-01 MED ORDER — TRAMADOL HCL 50 MG PO TABS: 50.0000 mg | ORAL_TABLET | Freq: Two times a day (BID) | ORAL | 0 refills | Status: AC | PRN

## 2021-04-06 ENCOUNTER — Other Ambulatory Visit: Payer: Self-pay

## 2021-04-06 ENCOUNTER — Ambulatory Visit: Payer: BC Managed Care – PPO | Admitting: Family Medicine

## 2021-04-06 VITALS — BP 145/79 | HR 77 | Temp 97.6°F | Wt 123.4 lb

## 2021-04-06 DIAGNOSIS — M79641 Pain in right hand: Secondary | ICD-10-CM | POA: Diagnosis not present

## 2021-04-06 DIAGNOSIS — M79642 Pain in left hand: Secondary | ICD-10-CM

## 2021-04-06 NOTE — Progress Notes (Signed)
Pt is still c/o hand pain. Patient would like to see a hand specialist  if possible, or xray of hands

## 2021-04-07 ENCOUNTER — Encounter: Payer: Self-pay | Admitting: Family Medicine

## 2021-04-07 NOTE — Progress Notes (Signed)
Established Patient Office Visit  Subjective:  Patient ID: Todd [name], male    DOB: December 26, 1960  Age: 60 y.o. MRN: [id number]  CC:  Chief Complaint  Patient presents with   Follow-up   Hand Pain    HPI Todd [name] presents for follow up of hand pain. Reports sxs persist. Difficulties at work and with ADLs 2/2 sx.   Past Medical History:  Diagnosis Date   Diabetes mellitus without complication [facility])    Male erectile disorder     Past Surgical History:  Procedure Laterality Date   NO PAST SURGERIES      Family History  Problem Relation Age of Onset   Diabetes Father    Diabetes Brother     Social History   Socioeconomic History   Marital status: Single    Spouse name: Not on file   Number of children: Not on file   Years of education: Not on file   Highest education level: Not on file  Occupational History   Not on file  Tobacco Use   Smoking status: Never   Smokeless tobacco: Never  Substance and Sexual Activity   Alcohol use: Yes   Drug use: No   Sexual activity: Yes    Birth control/protection: None    Comment: number of sex partners in the last 12 months 4  Other Topics Concern   Not on file  Social History Narrative   Not on file   Social Determinants of Health   Financial Resource Strain: Not on file  Food Insecurity: Not on file  Transportation Needs: Not on file  Physical Activity: Not on file  Stress: Not on file  Social Connections: Not on file  Intimate Partner Violence: Not on file    ROS Review of Systems  Musculoskeletal:  Positive for arthralgias (hands).  All other systems reviewed and are negative.  Objective:   Today's Vitals: BP (!) 145/79   Pulse 77   Temp 97.6 F (36.4 C) (Oral)   Wt 123 lb 6.4 oz (56 kg)   SpO2 100%   BMI 18.76 kg/m   Physical Exam Vitals and nursing note reviewed.  Constitutional:      General: He is not in acute distress. Cardiovascular:     Rate and Rhythm: Normal rate and regular  rhythm.  Pulmonary:     Effort: Pulmonary effort is normal.     Breath sounds: Normal breath sounds.  Abdominal:     Palpations: Abdomen is soft.     Tenderness: There is no abdominal tenderness.  Musculoskeletal:     Right hand: Deformity and tenderness present. No swelling. Decreased range of motion.     Left hand: Deformity and tenderness present. No swelling. Decreased range of motion.     Right lower leg: No edema.     Left lower leg: No edema.  Neurological:     General: No focal deficit present.     Mental Status: He is alert and oriented to person, place, and time.    Assessment & Plan:   1. Bilateral hand pain Labs were wnl.will defer further labs/imaging to consultant. Patient referred to ortho for further eval/mgt. - Ambulatory referral to Orthopedic Surgery    Outpatient Encounter Medications as of 04/06/2021  Medication Sig   meloxicam (MOBIC) 7.5 MG tablet Take 1 tablet (7.5 mg total) by mouth daily.   metFORMIN (GLUCOPHAGE-XR) 500 MG 24 hr tablet Take 1 tablet (500 mg total) by mouth 2 (two) times daily.  sildenafil (VIAGRA) 100 MG tablet Take 1 tablet (100 mg total) by mouth daily as needed.   tadalafil (CIALIS) 20 MG tablet Take 0.5-1 tablets (10-20 mg total) by mouth every other day as needed for erectile dysfunction.   traMADol (ULTRAM) 50 MG tablet Take 1 tablet (50 mg total) by mouth 2 (two) times daily as needed.   No facility-administered encounter medications on file as of 04/06/2021.    Follow-up: No follow-ups on file.   Becky Sax, MD

## 2021-04-23 ENCOUNTER — Ambulatory Visit: Payer: BC Managed Care – PPO | Admitting: Orthopedic Surgery

## 2021-04-23 ENCOUNTER — Other Ambulatory Visit: Payer: Self-pay

## 2021-04-29 ENCOUNTER — Encounter: Payer: Self-pay | Admitting: Orthopedic Surgery

## 2021-04-29 ENCOUNTER — Ambulatory Visit: Payer: Self-pay

## 2021-04-29 ENCOUNTER — Ambulatory Visit: Payer: BC Managed Care – PPO | Admitting: Orthopedic Surgery

## 2021-04-29 ENCOUNTER — Other Ambulatory Visit: Payer: Self-pay

## 2021-04-29 VITALS — BP 134/84 | HR 94 | Ht 68.0 in | Wt 131.8 lb

## 2021-04-29 DIAGNOSIS — M79642 Pain in left hand: Secondary | ICD-10-CM

## 2021-04-29 DIAGNOSIS — M79641 Pain in right hand: Secondary | ICD-10-CM | POA: Diagnosis not present

## 2021-04-29 MED ORDER — DICLOFENAC SODIUM 75 MG PO TBEC: 75.0000 mg | DELAYED_RELEASE_TABLET | Freq: Two times a day (BID) | ORAL | 0 refills | Status: AC

## 2021-04-29 NOTE — Progress Notes (Signed)
Office Visit Note   Patient: Todd [name]           Date of Birth: 1960/07/03           MRN: [id number] Visit Date: 04/29/2021              Requested by: Dorna Mai, [address] [place],  [place] [address] PCP: Dorna Mai, MD   Assessment & Plan: Visit Diagnoses:  1. Pain in right hand   2. Pain in left hand     Plan: Discussed that his x-rays are largely unremarkable.  He has no history of inflammatory arthropathy with several negative inflammatory labs.  He is a diabetic so some of his morning stiffness may be related to this condition.  He was prescribed meloxicam which seems to have helped.  We can try a different anti-inflammatory to see if he gets more relief.  I can see him back in another 4-6 weeks if needed.   Follow-Up Instructions: No follow-ups on file.   Orders:  Orders Placed This Encounter  Procedures   XR Hand Complete Right   XR Hand Complete Left   Meds ordered this encounter  Medications   diclofenac (VOLTAREN) 75 MG EC tablet    Sig: Take 1 tablet (75 mg total) by mouth 2 (two) times daily.    Dispense:  60 tablet    Refill:  0      Procedures: No procedures performed   Clinical Data: No additional findings.   Subjective: Chief Complaint  Patient presents with   Right Hand - New Patient (Initial Visit)   Left Hand - New Patient (Initial Visit)    This is a 60 year old right-hand-dominant male who works for [employer] and presents with bilateral hand stiffness and pain.  This started about 2 or 3 months ago.  He woke up with significant swelling in all his fingers and was unable make a complete fist.  The swelling has greatly improved.  He still notes that he got stiffness in all his fingers first in the morning.  All the joints seem to hurt equally.  He is able to make a complete fist but notes some tightness in all the joints.  He has no personal history or family history of inflammatory  arthropathy as far as he knows.  He was seen by his primary care provider who put him on meloxicam which she thinks has helped some.  He denies any numbness or paresthesias though he does have some diabetic neuropathy in both of his feet.   Review of Systems   Objective: Vital Signs: BP 134/84 (BP Location: Right Arm, Patient Position: Sitting, Cuff Size: Normal)   Pulse 94   Ht 5\' 8"  (1.727 m)   Wt 131 lb 12.8 oz (59.8 kg)   SpO2 97%   BMI 20.04 kg/m   Physical Exam Constitutional:      Appearance: Normal appearance.  Cardiovascular:     Rate and Rhythm: Normal rate.     Pulses: Normal pulses.  Pulmonary:     Effort: Pulmonary effort is normal.  Skin:    General: Skin is warm and dry.     Capillary Refill: Capillary refill takes less than 2 seconds.  Neurological:     Mental Status: He is alert.    Right Hand Exam   Tenderness  The patient is experiencing no tenderness.   Range of Motion  The patient has normal right wrist ROM.  Other  Erythema: absent Sensation: normal Pulse: present  Comments:  Able to make a complete fist but notes that all of his fingers feel tight.  No significant swelling today.    Left Hand Exam   Tenderness  The patient is experiencing no tenderness.   Range of Motion  The patient has normal left wrist ROM.  Other  Erythema: absent Sensation: normal Pulse: present  Comments:  Able to make a complete fist but notes that all of his fingers feel tight.  No significant swelling today.      Specialty Comments:  No specialty comments available.  Imaging: Multiple views of bilateral hands were taken today and reviewed interpreted by me.  They demonstrate minimal degenerative changes at the thumb [facility] and MP joint.  There is no changes at the finger MP joints.  There is no evidence of the PIP or DIP joints but this is somewhat limited by the views obtained.   PMFS History: Patient Active Problem List   Diagnosis Date Noted    Epididymitis 12/22/2020   Impotence of organic origin 09/26/2013   Hepatic steatosis 06/01/2012   Elevated liver function tests 05/18/2012   Alcohol abuse 05/18/2012   Type 2 diabetes mellitus without complication ([place]) 61/22/4497   Hypogonadism male 10/24/2011   ALCOHOL USE 08/17/2007   SLEEP APNEA 08/17/2007   Past Medical History:  Diagnosis Date   Diabetes mellitus without complication [facility])    Male erectile disorder     Family History  Problem Relation Age of Onset   Diabetes Father    Diabetes Brother     Past Surgical History:  Procedure Laterality Date   NO PAST SURGERIES     Social History   Occupational History   Not on file  Tobacco Use   Smoking status: Never   Smokeless tobacco: Never  Substance and Sexual Activity   Alcohol use: Yes   Drug use: No   Sexual activity: Yes    Birth control/protection: None    Comment: number of sex partners in the last 48 months 4

## 2021-06-10 ENCOUNTER — Ambulatory Visit: Payer: BC Managed Care – PPO | Admitting: Orthopedic Surgery

## 2021-07-22 ENCOUNTER — Other Ambulatory Visit: Payer: Self-pay | Admitting: Family

## 2021-07-22 DIAGNOSIS — E119 Type 2 diabetes mellitus without complications: Secondary | ICD-10-CM

## 2021-10-19 ENCOUNTER — Other Ambulatory Visit: Payer: Self-pay | Admitting: Family Medicine

## 2021-10-19 DIAGNOSIS — N529 Male erectile dysfunction, unspecified: Secondary | ICD-10-CM

## 2021-11-03 ENCOUNTER — Other Ambulatory Visit: Payer: Self-pay | Admitting: Family Medicine

## 2021-11-03 DIAGNOSIS — N529 Male erectile dysfunction, unspecified: Secondary | ICD-10-CM

## 2021-11-03 NOTE — Telephone Encounter (Signed)
Requested medications are due for refill today.  yes  Requested medications are on the active medications list.  yes  Last refill. 03/25/2021 #10 2 refills  Future visit scheduled.   no  Notes to clinic.  Medication refill failed protocol d/t expired labs.    Requested Prescriptions  Pending Prescriptions Disp Refills   tadalafil (CIALIS) 20 MG tablet [Pharmacy Med Name: Tadalafil 20 MG Oral Tablet] 10 tablet 0    Sig: TAKE 1/2 TO 1 (ONE-HALF TO ONE) TABLET BY MOUTH EVERY OTHER DAY AS NEEDED FOR ERECTILE DYSFUNCTION     Urology: Erectile Dysfunction Agents Failed - 11/03/2021  5:01 PM      Failed - AST in normal range and within 360 days    AST  Date Value Ref Range Status  08/06/2019 46 (H) 0 - 40 IU/L Final         Failed - ALT in normal range and within 360 days    ALT  Date Value Ref Range Status  08/06/2019 49 (H) 0 - 44 IU/L Final         Passed - Last BP in normal range    BP Readings from Last 1 Encounters:  04/29/21 134/84         Passed - Valid encounter within last 12 months    Recent Outpatient Visits           7 months ago Bilateral hand pain   [facility] at [facility], MD   7 months ago Type 2 diabetes mellitus without complication, unspecified whether long term insulin use [facility])   Primary Care at [facility], MD   10 months ago Epididymitis   [facility] at [facility], Cari S, PA-C   1 year ago Type 2 diabetes mellitus without complication, unspecified whether long term insulin use [facility])   Primary Care at [facility], Amy J, NP   1 year ago Type 2 diabetes mellitus without complication, unspecified whether long term insulin use [facility])   Primary Care at [facility], Bayard Beaver, MD

## 2022-01-06 ENCOUNTER — Emergency Department (HOSPITAL_COMMUNITY)
Admission: EM | Admit: 2022-01-06 | Discharge: 2022-01-06 | Disposition: A | Discharge status: Home / Self Care | Payer: Commercial Managed Care - HMO | Attending: Emergency Medicine | Admitting: Emergency Medicine

## 2022-01-06 ENCOUNTER — Other Ambulatory Visit: Payer: Self-pay

## 2022-01-06 ENCOUNTER — Encounter (HOSPITAL_COMMUNITY): Payer: Self-pay | Admitting: Oncology

## 2022-01-06 DIAGNOSIS — Z7984 Long term (current) use of oral hypoglycemic drugs: Secondary | ICD-10-CM | POA: Insufficient documentation

## 2022-01-06 DIAGNOSIS — E119 Type 2 diabetes mellitus without complications: Secondary | ICD-10-CM | POA: Diagnosis not present

## 2022-01-06 DIAGNOSIS — Y844 Aspiration of fluid as the cause of abnormal reaction of the patient, or of later complication, without mention of misadventure at the time of the procedure: Secondary | ICD-10-CM | POA: Insufficient documentation

## 2022-01-06 DIAGNOSIS — R079 Chest pain, unspecified: Secondary | ICD-10-CM | POA: Insufficient documentation

## 2022-01-06 DIAGNOSIS — T17308A Unspecified foreign body in larynx causing other injury, initial encounter: Secondary | ICD-10-CM

## 2022-01-06 DIAGNOSIS — R0989 Other specified symptoms and signs involving the circulatory and respiratory systems: Secondary | ICD-10-CM | POA: Insufficient documentation

## 2022-01-06 LAB — CBC WITH DIFFERENTIAL/PLATELET
Abs Immature Granulocytes: 0.03 10*3/uL (ref 0.00–0.07)
Basophils Absolute: 0.1 10*3/uL (ref 0.0–0.1)
Basophils Relative: 1 %
Eosinophils Absolute: 0 10*3/uL (ref 0.0–0.5)
Eosinophils Relative: 0 %
HCT: 45.1 % (ref 39.0–52.0)
Hemoglobin: 15.8 g/dL (ref 13.0–17.0)
Immature Granulocytes: 0 %
Lymphocytes Relative: 18 %
Lymphs Abs: 1.4 10*3/uL (ref 0.7–4.0)
MCH: 31.5 pg (ref 26.0–34.0)
MCHC: 35 g/dL (ref 30.0–36.0)
MCV: 90 fL (ref 80.0–100.0)
Monocytes Absolute: 0.6 10*3/uL (ref 0.1–1.0)
Monocytes Relative: 7 %
Neutro Abs: 5.9 10*3/uL (ref 1.7–7.7)
Neutrophils Relative %: 74 %
Platelets: 226 10*3/uL (ref 150–400)
RBC: 5.01 MIL/uL (ref 4.22–5.81)
RDW: 12.7 % (ref 11.5–15.5)
WBC: 8 10*3/uL (ref 4.0–10.5)
nRBC: 0 % (ref 0.0–0.2)

## 2022-01-06 LAB — BASIC METABOLIC PANEL
Anion gap: 10 (ref 5–15)
BUN: 18 mg/dL (ref 6–20)
CO2: 25 mmol/L (ref 22–32)
Calcium: 9.9 mg/dL (ref 8.9–10.3)
Chloride: 107 mmol/L (ref 98–111)
Creatinine, Ser: 1.12 mg/dL (ref 0.61–1.24)
GFR, Estimated: >60 mL/min (ref >60)
Glucose, Bld: 215 mg/dL — ABNORMAL HIGH (ref 70–99)
Potassium: 3.7 mmol/L (ref 3.5–5.1)
Sodium: 142 mmol/L (ref 135–145)

## 2022-01-06 MED ORDER — GLUCAGON HCL RDNA (DIAGNOSTIC) 1 MG IJ SOLR
1.0000 mg | Freq: Once | INTRAMUSCULAR | Status: AC
Start: 2022-01-06 — End: 2022-01-06
  Administered 2022-01-06: 1 mg via INTRAVENOUS
  Filled 2022-01-06: qty 1

## 2022-01-06 MED ORDER — ONDANSETRON HCL 4 MG/2ML IJ SOLN
4.0000 mg | Freq: Once | INTRAMUSCULAR | Status: AC
  Administered 2022-01-06: 4 mg via INTRAVENOUS
  Filled 2022-01-06: qty 2

## 2022-01-06 NOTE — ED Notes (Signed)
Pt left without getting discharge papers and last set of vitals, provider made aware

## 2022-01-06 NOTE — Discharge Instructions (Signed)
Your history, exam, work-up today are consistent with a food bolus impaction from the chicken you had.  As you report you have choked on approximately 20 pieces of steak over the years, and report a family history of needing esophageal dilations, I do suspect need to follow-up with outpatient gastroenterology.  I include the numbers, please consider calling them for close follow-up.  To be careful eating meats and large food items and make sure you are staying hydrated.  If any symptoms change or worsen, please return to the nearest emergency department.

## 2022-01-06 NOTE — ED Provider Notes (Signed)
[facility] DEPT Provider Note   CSN: [id number] Arrival date & time: 01/06/22  1752     History  Chief Complaint  Patient presents with   Aspiration    Todd [name] is a 61 y.o. male.  The history is provided by the patient and medical records. No language interpreter was used.  Illness Location:  Choked on chicken wing Severity:  Severe Onset quality:  Sudden Duration:  3 hours Timing:  Constant Progression:  Unchanged Chronicity:  Recurrent Associated symptoms: chest pain   Associated symptoms: no abdominal pain, no congestion, no cough, no diarrhea, no fatigue, no fever, no headaches, no loss of consciousness, no nausea, no rash, no shortness of breath, no vomiting and no wheezing        Home Medications Prior to Admission medications   Medication Sig Start Date End Date Taking? Authorizing Provider  meloxicam (MOBIC) 7.5 MG tablet Take 1 tablet (7.5 mg total) by mouth daily. 03/25/21   Dorna Mai, MD  metFORMIN (GLUCOPHAGE-XR) 500 MG 24 hr tablet Take 1 tablet by mouth twice daily 07/23/21   Dorna Mai, MD  sildenafil (VIAGRA) 100 MG tablet Take 1 tablet (100 mg total) by mouth daily as needed. 08/03/20   Camillia Herter, NP  tadalafil (CIALIS) 20 MG tablet Take 0.5-1 tablets (10-20 mg total) by mouth every other day as needed for erectile dysfunction. 03/25/21   Dorna Mai, MD  traMADol (ULTRAM) 50 MG tablet Take 1 tablet (50 mg total) by mouth 2 (two) times daily as needed. 04/01/21   Dorna Mai, MD      Allergies    Patient has no known allergies.    Review of Systems   Review of Systems  Constitutional:  Negative for chills, fatigue and fever.  HENT:  Negative for congestion.   Eyes:  Negative for visual disturbance.  Respiratory:  Negative for cough, chest tightness, shortness of breath and wheezing.   Cardiovascular:  Positive for chest pain.  Gastrointestinal:  Negative for abdominal pain, constipation,  diarrhea, nausea and vomiting.  Genitourinary:  Negative for dysuria.  Musculoskeletal:  Negative for back pain.  Skin:  Negative for rash.  Neurological:  Negative for dizziness, loss of consciousness, light-headedness and headaches.  Psychiatric/Behavioral:  Negative for agitation.   All other systems reviewed and are negative.   Physical Exam Updated Vital Signs BP (!) 156/97 (BP Location: Right Arm)   Pulse 92   Temp 98.4 F (36.9 C) (Oral)   Resp (!) 26   Ht '5\' 8"'$  (1.727 m)   Wt 63.5 kg   SpO2 100%   BMI 21.29 kg/m  Physical Exam Vitals and nursing note reviewed.  Constitutional:      General: He is not in acute distress.    Appearance: He is well-developed. He is not ill-appearing, toxic-appearing or diaphoretic.  HENT:     Head: Normocephalic and atraumatic.  Eyes:     Conjunctiva/sclera: Conjunctivae normal.  Cardiovascular:     Rate and Rhythm: Normal rate and regular rhythm.     Heart sounds: No murmur heard. Pulmonary:     Effort: Pulmonary effort is normal. No respiratory distress.     Breath sounds: Normal breath sounds.  Abdominal:     Palpations: Abdomen is soft.     Tenderness: There is no abdominal tenderness.  Musculoskeletal:        General: No swelling.     Cervical back: Neck supple.  Skin:    General: Skin is  warm and dry.     Capillary Refill: Capillary refill takes less than 2 seconds.  Neurological:     Mental Status: He is alert.  Psychiatric:        Mood and Affect: Mood normal.     ED Results / Procedures / Treatments   Labs (all labs ordered are listed, but only abnormal results are displayed) Labs Reviewed  BASIC METABOLIC PANEL - Abnormal; Notable for the following components:      Result Value   Glucose, Bld 215 (*)    All other components within normal limits  CBC WITH DIFFERENTIAL/PLATELET    EKG EKG Interpretation  Date/Time:  Thursday January 06 2022 18:59:49 EDT Ventricular Rate:  90 PR Interval:  126 QRS  Duration: 85 QT Interval:  370 QTC Calculation: 453 R Axis:   28 Text Interpretation: Sinus rhythm when compared to prior, similar appearance. No STEMI Confirmed by Antony Blackbird [id number] on 01/06/2022 7:48:11 PM  Radiology No results found.  Procedures Procedures    Medications Ordered in ED Medications  ondansetron (ZOFRAN) injection 4 mg (4 mg Intravenous Given 01/06/22 1944)  glucagon (human recombinant) (GLUCAGEN) injection 1 mg (1 mg Intravenous Given 01/06/22 1945)    ED Course/ Medical Decision Making/ A&P                           Medical Decision Making Risk Prescription drug management.    Todd [name] is a 61 y.o. male with a past medical history significant for diabetes, sleep apnea, previous documentation of alcohol abuse, hepatic steatosis, and patient report of previous choking episodes who presents with choking.  According to patient, he was eating a chicken wing this afternoon and a piece got stuck.  He reports that he has had approximately 20 pieces of steak stuck over the years but has always been able to pass or cough it up.  He reports he is never had to go to the emergency department for it.  He reports that he has not seen gastroenterology ever and reports he is feeling at his baseline before this episode this afternoon.  He reports he tried to drink liquids and sodas to dissolve it but it did not help.  He is not tolerating his own secretions and is spitting into a bag.  He reports some chest discomfort in his central chest from this and feels like it is still stuck.  He denies any shortness of breath, fevers, chills, constipation, diarrhea, or urinary changes.  Denies fevers.  Reports the last time something was stuck was 1 month ago with a piece of steak.  On my exam, lungs were clear.  Chest was nontender.  I do not appreciate stridor.  Oropharyngeal exam unremarkable.  Patient is drooling and spitting into the bag but otherwise is resting.   Clinical aspect  patient has a food bolus impaction of the chicken wing stuck in his throat.  As he is already tried Acitak fluid such as soda, we will try some Zofran and glucagon to see if this passes.  If it fails, will call gastroenterology to discuss further management.  Anticipate reassessment after medication trial.  After glucagon, patient had resolution of symptoms and feels that he passed it.  He was able to drink without difficulty and would like to go home.  Due to the recurrence of this and him now saying that his son had to have an esophageal dilation, I do feel needs to  follow-up with gastroenterology.  We will give him instructions to call GI for close follow-up and to be careful with eating meats and other large items.  Patient agrees and will be discharged shortly.         Final Clinical Impression(s) / ED Diagnoses Final diagnoses:  Choking, initial encounter    Rx / DC Orders ED Discharge Orders     None       Clinical Impression: 1. Choking, initial encounter     Disposition: Discharge  Condition: Good  I have discussed the results, Dx and Tx plan with the pt(& family if present). He/she/they expressed understanding and agree(s) with the plan. Discharge instructions discussed at great length. Strict return precautions discussed and pt &/or family have verbalized understanding of the instructions. No further questions at time of discharge.    Discharge Medication List as of 01/06/2022  9:12 PM      Follow Up: Gastroenterology, [address] [place] [address] [phone]     [facility] Gastroenterology [place] [id number] [place] DEPT [place] [id number] mc [address] [place] [phone]         Linford Quintela, Gwenyth Allegra, MD 01/06/22 2113

## 2022-01-06 NOTE — ED Triage Notes (Signed)
Pt reports getting a bite of chicken stuck in his throat approximately 3 hours ago. Pt states he is unable to swallow anything at this time. Airway is patent at this time.

## 2022-01-06 NOTE — ED Provider Triage Note (Signed)
Emergency Medicine Provider Triage Evaluation Note  Todd [name] , a 61 y.o. male  was evaluated in triage.  Pt complains of food stuck in his throat.  Stated he was eating pizza chicken, got stuck about 3 hours ago.  Was able to get out most of the food, but still has some remaining.  Denies prior surgeries of the throat or esophagus.  Complaining of some chest wall soreness and throat soreness from constant drooling and spitting up into a bag.  No shortness of breath.  Review of Systems  Positive: Negative: See above  Physical Exam  BP (!) 144/99 (BP Location: Right Arm)   Pulse 95   Temp 98.3 F (36.8 C) (Oral)   Resp 18   Ht '5\' 8"'$  (1.727 m)   Wt 63.5 kg   SpO2 99%   BMI 21.29 kg/m  Gen:   Awake, no distress, appears uncomfortable Resp:  Normal effort, CTAB MSK:   Moves extremities without difficulty  Other:  Drooling into emesis bag.  Unable to swallow saliva secretions.  AAOx4.  Medical Decision Making  Medically screening exam initiated at 6:14 PM.  Appropriate orders placed.  [name] was informed that the remainder of the evaluation will be completed by another provider, this initial triage assessment does not replace that evaluation, and the importance of remaining in the ED until their evaluation is complete.     Prince Rome, PA-C 41/42/39 [id number]

## 2022-01-06 NOTE — ED Notes (Signed)
Pt able to drink a sprite and keep it down without difficulties

## 2022-01-20 ENCOUNTER — Ambulatory Visit (INDEPENDENT_AMBULATORY_CARE_PROVIDER_SITE_OTHER): Payer: Commercial Managed Care - HMO | Admitting: Family Medicine

## 2022-01-20 ENCOUNTER — Encounter: Payer: Self-pay | Admitting: Family Medicine

## 2022-01-20 VITALS — BP 115/76 | HR 86 | Temp 98.1°F | Resp 16 | Ht 68.0 in | Wt 146.0 lb

## 2022-01-20 DIAGNOSIS — E119 Type 2 diabetes mellitus without complications: Secondary | ICD-10-CM | POA: Diagnosis not present

## 2022-01-20 DIAGNOSIS — R1310 Dysphagia, unspecified: Secondary | ICD-10-CM | POA: Diagnosis not present

## 2022-01-20 LAB — POCT GLYCOSYLATED HEMOGLOBIN (HGB A1C): Hemoglobin A1C: 8.7 % — AB (ref 4.0–5.6)

## 2022-01-20 MED ORDER — PANTOPRAZOLE SODIUM 40 MG PO TBEC: 40.0000 mg | DELAYED_RELEASE_TABLET | Freq: Every day | ORAL | 0 refills | Status: DC

## 2022-01-20 MED ORDER — SILDENAFIL CITRATE 100 MG PO TABS: ORAL_TABLET | ORAL | 5 refills | Status: AC

## 2022-01-20 MED ORDER — METFORMIN HCL ER 500 MG PO TB24: 500.0000 mg | ORAL_TABLET | Freq: Two times a day (BID) | ORAL | 0 refills | Status: DC

## 2022-01-20 NOTE — Progress Notes (Signed)
Patient is requesting medication refill. Patient is also requesting a referral to GI doctor for consultation on food get stuck in his esophagus.

## 2022-01-20 NOTE — Progress Notes (Signed)
Established Patient Office Visit  Subjective    Patient ID: Todd [name], male    DOB: Dec 20, 1960  Age: 61 y.o. MRN: [id number]  CC:  Chief Complaint  Patient presents with   Follow-up    HPI Todd [name] presents for follow up of diabetes and for complaint of difficulty swallowing such as meat. He reports that he feels that it gets stuck on the way down to his stomach. Patient also reports that he has not had his metformin for several months.    Outpatient Encounter Medications as of 01/20/2022  Medication Sig   meloxicam (MOBIC) 7.5 MG tablet Take 1 tablet (7.5 mg total) by mouth daily.   pantoprazole (PROTONIX) 40 MG tablet Take 1 tablet (40 mg total) by mouth daily.   tadalafil (CIALIS) 20 MG tablet Take 0.5-1 tablets (10-20 mg total) by mouth every other day as needed for erectile dysfunction.   traMADol (ULTRAM) 50 MG tablet Take 1 tablet (50 mg total) by mouth 2 (two) times daily as needed.   [DISCONTINUED] metFORMIN (GLUCOPHAGE-XR) 500 MG 24 hr tablet Take 1 tablet by mouth twice daily   [DISCONTINUED] sildenafil (VIAGRA) 100 MG tablet 1 tablet as needed Orally Once a day for 30 day(s)   metFORMIN (GLUCOPHAGE-XR) 500 MG 24 hr tablet Take 1 tablet (500 mg total) by mouth 2 (two) times daily.   sildenafil (VIAGRA) 100 MG tablet 1 tablet as needed Orally Once a day for 30 day(s)   [DISCONTINUED] metFORMIN (GLUCOPHAGE) 500 MG tablet 1 tablet with a meal Orally three times a day   [DISCONTINUED] sildenafil (VIAGRA) 100 MG tablet Take 1 tablet (100 mg total) by mouth daily as needed.   No facility-administered encounter medications on file as of 01/20/2022.    Past Medical History:  Diagnosis Date   Diabetes mellitus without complication [facility])    Male erectile disorder     Past Surgical History:  Procedure Laterality Date   NO PAST SURGERIES      Family History  Problem Relation Age of Onset   Diabetes Father    Diabetes Brother     Social History    Socioeconomic History   Marital status: Single    Spouse name: Not on file   Number of children: Not on file   Years of education: Not on file   Highest education level: Not on file  Occupational History   Not on file  Tobacco Use   Smoking status: Never   Smokeless tobacco: Never  Substance and Sexual Activity   Alcohol use: Yes   Drug use: No   Sexual activity: Yes    Birth control/protection: None    Comment: number of sex partners in the last 12 months 4  Other Topics Concern   Not on file  Social History Narrative   Not on file   Social Determinants of Health   Financial Resource Strain: Not on file  Food Insecurity: Not on file  Transportation Needs: Not on file  Physical Activity: Not on file  Stress: Not on file  Social Connections: Not on file  Intimate Partner Violence: Not on file    Review of Systems  All other systems reviewed and are negative.       Objective    BP 115/76   Pulse 86   Temp 98.1 F (36.7 C) (Oral)   Resp 16   Ht '5\' 8"'$  (1.727 m)   Wt 146 lb (66.2 kg)   SpO2 97%  BMI 22.20 kg/m   Physical Exam Vitals and nursing note reviewed.  Constitutional:      General: He is not in acute distress. HENT:     Mouth/Throat:     Mouth: Mucous membranes are moist.     Pharynx: Oropharynx is clear.  Cardiovascular:     Rate and Rhythm: Normal rate and regular rhythm.  Pulmonary:     Effort: Pulmonary effort is normal.     Breath sounds: Normal breath sounds.  Abdominal:     Palpations: Abdomen is soft.     Tenderness: There is no abdominal tenderness.  Neurological:     General: No focal deficit present.     Mental Status: He is alert and oriented to person, place, and time.         Assessment & Plan:   1. Type 2 diabetes mellitus without complication, unspecified whether long term insulin use (HCC) Increased A1c and no longer at goal. Discussed complinace. Meds refilled. Continue and monitor - Microalbumin / creatinine  urine ratio - POCT glycosylated hemoglobin (Hb A1C) - metFORMIN (GLUCOPHAGE-XR) 500 MG 24 hr tablet; Take 1 tablet (500 mg total) by mouth 2 (two) times daily.  Dispense: 180 tablet; Refill: 0  2. Dysphagia, unspecified type Discussed dietary and activity options. Prescribed Pantoprazole. Patient deferred referral to GI until follow up.   Return in about 3 months (around 04/21/2022) for follow up.   Becky Sax, MD

## 2022-04-21 ENCOUNTER — Ambulatory Visit: Payer: 59 | Admitting: Family Medicine

## 2022-05-10 ENCOUNTER — Ambulatory Visit (INDEPENDENT_AMBULATORY_CARE_PROVIDER_SITE_OTHER): Payer: Commercial Managed Care - HMO | Admitting: Family Medicine

## 2022-05-10 ENCOUNTER — Encounter: Payer: Self-pay | Admitting: Family Medicine

## 2022-05-10 VITALS — BP 141/88 | HR 91 | Temp 98.1°F | Resp 16 | Wt 148.4 lb

## 2022-05-10 DIAGNOSIS — E1165 Type 2 diabetes mellitus with hyperglycemia: Secondary | ICD-10-CM | POA: Diagnosis not present

## 2022-05-10 DIAGNOSIS — R350 Frequency of micturition: Secondary | ICD-10-CM

## 2022-05-10 DIAGNOSIS — F329 Major depressive disorder, single episode, unspecified: Secondary | ICD-10-CM

## 2022-05-10 DIAGNOSIS — E119 Type 2 diabetes mellitus without complications: Secondary | ICD-10-CM | POA: Insufficient documentation

## 2022-05-10 DIAGNOSIS — K219 Gastro-esophageal reflux disease without esophagitis: Secondary | ICD-10-CM

## 2022-05-10 DIAGNOSIS — N529 Male erectile dysfunction, unspecified: Secondary | ICD-10-CM

## 2022-05-10 LAB — POCT URINALYSIS DIP (CLINITEK)
Bilirubin, UA: NEGATIVE
Glucose, UA: 500 mg/dL — AB
Leukocytes, UA: NEGATIVE
Nitrite, UA: NEGATIVE
Spec Grav, UA: 1.025 (ref 1.010–1.025)
Urobilinogen, UA: 1 E.U./dL
pH, UA: 7 (ref 5.0–8.0)

## 2022-05-10 LAB — POCT GLYCOSYLATED HEMOGLOBIN (HGB A1C): Hemoglobin A1C: 8.7 % — AB (ref 4.0–5.6)

## 2022-05-10 MED ORDER — GLIPIZIDE 10 MG PO TABS: 10.0000 mg | ORAL_TABLET | Freq: Two times a day (BID) | ORAL | 1 refills | Status: AC

## 2022-05-10 MED ORDER — TADALAFIL 20 MG PO TABS: 10.0000 mg | ORAL_TABLET | ORAL | 2 refills | Status: AC | PRN

## 2022-05-10 MED ORDER — PANTOPRAZOLE SODIUM 40 MG PO TBEC
40.0000 mg | DELAYED_RELEASE_TABLET | Freq: Every day | ORAL | 1 refills | Status: AC
Start: 2022-05-10 — End: ?

## 2022-05-10 MED ORDER — MIRTAZAPINE 15 MG PO TABS: 15.0000 mg | ORAL_TABLET | Freq: Every day | ORAL | 1 refills | Status: AC

## 2022-05-10 MED ORDER — METFORMIN HCL ER 500 MG PO TB24: 500.0000 mg | ORAL_TABLET | Freq: Two times a day (BID) | ORAL | 1 refills | Status: AC

## 2022-05-10 NOTE — Progress Notes (Unsigned)
Patient is here for their 3/6 month follow-up Patient has no concerns today Care gaps have been discussed with patient  

## 2022-05-11 ENCOUNTER — Encounter: Payer: Self-pay | Admitting: Family Medicine

## 2022-05-11 NOTE — Progress Notes (Signed)
Established Patient Office Visit  Subjective    Patient ID: Todd [name], male    DOB: 07-Oct-1960  Age: 61 y.o. MRN: [id number]  CC:  Chief Complaint  Patient presents with   Follow-up    HPI WESTON KALLMAN presents for routine follow up of chronic med issues. He reports urinary frequency.    Outpatient Encounter Medications as of 05/10/2022  Medication Sig   glipiZIDE (GLUCOTROL) 10 MG tablet Take 1 tablet (10 mg total) by mouth 2 (two) times daily before a meal.   mirtazapine (REMERON) 15 MG tablet Take 1 tablet (15 mg total) by mouth at bedtime.   meloxicam (MOBIC) 7.5 MG tablet Take 1 tablet (7.5 mg total) by mouth daily.   metFORMIN (GLUCOPHAGE-XR) 500 MG 24 hr tablet Take 1 tablet (500 mg total) by mouth 2 (two) times daily.   pantoprazole (PROTONIX) 40 MG tablet Take 1 tablet (40 mg total) by mouth daily.   sildenafil (VIAGRA) 100 MG tablet 1 tablet as needed Orally Once a day for 30 day(s)   tadalafil (CIALIS) 20 MG tablet Take 0.5-1 tablets (10-20 mg total) by mouth every other day as needed for erectile dysfunction.   traMADol (ULTRAM) 50 MG tablet Take 1 tablet (50 mg total) by mouth 2 (two) times daily as needed.   [DISCONTINUED] metFORMIN (GLUCOPHAGE-XR) 500 MG 24 hr tablet Take 1 tablet (500 mg total) by mouth 2 (two) times daily.   [DISCONTINUED] pantoprazole (PROTONIX) 40 MG tablet Take 1 tablet (40 mg total) by mouth daily.   [DISCONTINUED] tadalafil (CIALIS) 20 MG tablet Take 0.5-1 tablets (10-20 mg total) by mouth every other day as needed for erectile dysfunction.   No facility-administered encounter medications on file as of 05/10/2022.    Past Medical History:  Diagnosis Date   Diabetes mellitus without complication [facility])    Male erectile disorder     Past Surgical History:  Procedure Laterality Date   NO PAST SURGERIES      Family History  Problem Relation Age of Onset   Diabetes Father    Diabetes Brother     Social History   Socioeconomic  History   Marital status: Single    Spouse name: Not on file   Number of children: Not on file   Years of education: Not on file   Highest education level: Not on file  Occupational History   Not on file  Tobacco Use   Smoking status: Never   Smokeless tobacco: Never  Substance and Sexual Activity   Alcohol use: Yes   Drug use: No   Sexual activity: Yes    Birth control/protection: None    Comment: number of sex partners in the last 12 months 4  Other Topics Concern   Not on file  Social History Narrative   Not on file   Social Determinants of Health   Financial Resource Strain: Not on file  Food Insecurity: Not on file  Transportation Needs: Not on file  Physical Activity: Not on file  Stress: Not on file  Social Connections: Not on file  Intimate Partner Violence: Not on file    Review of Systems  All other systems reviewed and are negative.       Objective    BP (!) 141/88   Pulse 91   Temp 98.1 F (36.7 C) (Oral)   Resp 16   Wt 148 lb 6.4 oz (67.3 kg)   SpO2 97%   BMI 22.56 kg/m   Physical  Exam Vitals and nursing note reviewed.  Constitutional:      General: He is not in acute distress. HENT:     Mouth/Throat:     Mouth: Mucous membranes are moist.     Pharynx: Oropharynx is clear.  Cardiovascular:     Rate and Rhythm: Normal rate and regular rhythm.  Pulmonary:     Effort: Pulmonary effort is normal.     Breath sounds: Normal breath sounds.  Abdominal:     Palpations: Abdomen is soft.     Tenderness: There is no abdominal tenderness.  Neurological:     General: No focal deficit present.     Mental Status: He is alert and oriented to person, place, and time.         Assessment & Plan:   1. Type 2 diabetes mellitus with hyperglycemia, without long-term current use of insulin (HCC) Elevated A1c. Discussed compliance. Will add glipizide 10 mg BID to regimen and monitor - POCT glycosylated hemoglobin (Hb A1C) - Microalbumin /  creatinine urine ratio  2. Urine frequency Most likely 2/2 elevated glucose. Monitor - POCT URINALYSIS DIP (CLIN[facility]K)  3. Reactive depression 2/2 increased social issues including ill father. Remeron 15 mg prescribed. monitor  4. Gastroesophageal reflux disease without esophagitis Meds refilled.   5. Erectile dysfunction, unspecified erectile dysfunction type Med refilled.  - tadalafil (CIALIS) 20 MG tablet; Take 0.5-1 tablets (10-20 mg total) by mouth every other day as needed for erectile dysfunction.  Dispense: 10 tablet; Refill: 2   Return in about 3 months (around 08/09/2022) for follow up.   Becky Sax, MD

## 2022-05-12 LAB — MICROALBUMIN / CREATININE URINE RATIO
Creatinine, Urine: 50.7 mg/dL
Microalb/Creat Ratio: 34 mg/g creat — ABNORMAL HIGH (ref 0–29)
Microalbumin, Urine: 17.1 ug/mL

## 2022-05-25 ENCOUNTER — Ambulatory Visit: Payer: Self-pay | Admitting: *Deleted

## 2022-05-25 NOTE — Telephone Encounter (Signed)
Second attempt to reach pt, VM not set up.

## 2022-05-25 NOTE — Telephone Encounter (Signed)
Summary: exposed to covid   Patient states that he has been exposed to covid at work and he thinks he might have it. Patient is tired and has no energy and his muscles are very sore. First available appointment is 05/31/2022.  Please advise       Attempted to reach pt, "VM not set up."

## 2022-05-25 NOTE — Telephone Encounter (Signed)
Attempted to reach pt. Sending to practice for PCPs resolution per protocol.

## 2022-05-26 ENCOUNTER — Ambulatory Visit: Payer: Self-pay | Admitting: Physician Assistant

## 2022-05-26 ENCOUNTER — Encounter: Payer: Self-pay | Admitting: Physician Assistant

## 2022-05-26 DIAGNOSIS — Z20822 Contact with and (suspected) exposure to covid-19: Secondary | ICD-10-CM

## 2022-05-26 DIAGNOSIS — R6883 Chills (without fever): Secondary | ICD-10-CM

## 2022-05-26 NOTE — Telephone Encounter (Signed)
  Chief Complaint: COVID exposure at work Symptoms: fatigue, body aches Frequency: possible exposure Friday Pertinent Negatives: Patient denies SOB,cough,fever Disposition: '[]'$ ED /'[]'$ Urgent Care (no appt availability in office) / '[]'$ Appointment(In office/virtual)/ '[]'$  [facility]/ '[]'$ Home Care/ '[]'$ Refused Recommended Disposition /'[x]'$ [facility]/ '[]'$  Follow-up with PCP Additional Notes: Patient states he may have COVID- he wants to know where he can get tested: no open appointment in office- advised mobile unit.(Patient has contact pharmacy- they are booked- does not want to do home test)

## 2022-05-26 NOTE — Telephone Encounter (Signed)
Reason for Disposition . [1] COVID-19 infection suspected by caller or triager AND [2] mild symptoms (cough, fever, or others) AND [3] has not gotten tested yet  Answer Assessment - Initial Assessment Questions 1. COVID-19 EXPOSURE: "Please describe how you were exposed to someone with a COVID-19 infection."     Work- patient works closely with other employees  2. PLACE of CONTACT: "Where were you when you were exposed to COVID-19?" (e.g., home, school, medical waiting room; which city?)     work 3. TYPE of CONTACT: "How much contact was there?" (e.g., sitting next to, live in same house, work in same office, same building)     Work contact 4. DURATION of CONTACT: "How long were you in contact with the COVID-19 patient?" (e.g., a few seconds, passed by person, a few minutes, 15 minutes or longer, live with the patient)     work 5. MASK: "Were you wearing a mask?" "Was the other person wearing a mask?" Note: wearing a mask reduces the risk of an otherwise close contact.     no 6. DATE of CONTACT: "When did you have contact with a COVID-19 patient?" (e.g., how many days ago)     Friday 7. COMMUNITY SPREAD: "Do you live in or have you traveled to an area where there are lots of COVID-19 cases (community spread)?" (See public health department website, if unsure)       yes 8. SYMPTOMS: "Do you have any symptoms?" (e.g., fever, cough, breathing difficulty, loss of taste or smell)     Body aches, fatigue, hot/cold  11. HIGH RISK: "Do you have any heart or lung problems?" (e.g., asthma, COPD, heart failure) "Do you have a weak immune system or other risk factors?" (e.g., HIV positive, chemotherapy, renal failure, diabetes mellitus, sickle cell anemia, obesity)       Diabetic  Protocols used: Coronavirus (COVID-19) Exposure-A-AH, Coronavirus (COVID-19) Diagnosed or Suspected-A-AH

## 2022-05-26 NOTE — Progress Notes (Signed)
Established Patient Office Visit  Subjective   Patient ID: Todd [name], male    DOB: July 11, 1960  Age: 62 y.o. MRN: [id number]  Chief Complaint  Patient presents with   Covid Exposure   Virtual Visit via Telephone Note  I connected with Todd [name] on 05/26/22 at  4:20 PM EST by telephone and verified that I am speaking with the correct person using two identifiers.  Location: Patient: Car  Provider: [facility] Medicine Unit    I discussed the limitations, risks, security and privacy concerns of performing an evaluation and management service by telephone and the availability of in person appointments. I also discussed with the patient that there may be a patient responsible charge related to this service. The patient expressed understanding and agreed to proceed.   History of Present Illness: States that he started experiencing fatigue, body aches, chills, feeling "hot and cold" on Sunday, 5 days ago.  Denies headache or cough.  States that he does feel his symptoms have improved and is feeling much better today.  States that he has been using TheraFlu, pain cream and Advil.  States that he is eating and drinking okay.  States that he had a close exposure to COVID with his supervisor riding the same vehicle with them on Friday.  States 2 previous COVID vaccines, is concerned he has COVID and wants to know for sure prior to being around his elderly parents.   Observations/Objective: Medical history and current medications reviewed, no physical exam completed     Past Medical History:  Diagnosis Date   Diabetes mellitus without complication [facility])    Male erectile disorder    Social History   Socioeconomic History   Marital status: Single    Spouse name: Not on file   Number of children: Not on file   Years of education: Not on file   Highest education level: Not on file  Occupational History   Not on file  Tobacco Use   Smoking status: Never    Smokeless tobacco: Never  Substance and Sexual Activity   Alcohol use: Yes   Drug use: No   Sexual activity: Yes    Birth control/protection: None    Comment: number of sex partners in the last 12 months 4  Other Topics Concern   Not on file  Social History Narrative   Not on file   Social Determinants of Health   Financial Resource Strain: Not on file  Food Insecurity: Not on file  Transportation Needs: Not on file  Physical Activity: Not on file  Stress: Not on file  Social Connections: Not on file  Intimate Partner Violence: Not on file   Family History  Problem Relation Age of Onset   Diabetes Father    Diabetes Brother    No Known Allergies  Review of Systems  Constitutional:  Positive for chills and malaise/fatigue.  HENT:  Negative for congestion, ear pain and sore throat.   Respiratory:  Negative for cough, shortness of breath and wheezing.   Cardiovascular:  Negative for chest pain.  Gastrointestinal:  Negative for abdominal pain, nausea and vomiting.  Genitourinary: Negative.   Musculoskeletal:  Positive for myalgias.  Skin: Negative.   Neurological:  Negative for headaches.  Endo/Heme/Allergies: Negative.   Psychiatric/Behavioral: Negative.        Assessment & Plan:   Problem List Items Addressed This Visit   None Visit Diagnoses     Exposure to COVID-19 virus    -  Primary   Relevant Orders   COVID-19, Flu A+B and RSV      Assessment and Plan: 1. Exposure to COVID-19 virus Patient declines treatment with Paxlovid.  Explained to patient turnaround time COVID testing, encouraged continued supportive care, continued isolation and masking protocols.  Red flags given for prompt reevaluation - COVID-19, Flu A+B and RSV  Patient declined AVS, does not want to wait for one to be printed, states unable to use MyChart.  Follow Up Instructions:    I discussed the assessment and treatment plan with the patient. The patient was provided an opportunity  to ask questions and all were answered. The patient agreed with the plan and demonstrated an understanding of the instructions.   The patient was advised to call back or seek an in-person evaluation if the symptoms worsen or if the condition fails to improve as anticipated.  I provided 12 minutes of non-face-to-face time during this encounter.   Return if symptoms worsen or fail to improve.    Loraine Grip Mayers, PA-C

## 2022-05-28 LAB — COVID-19, FLU A+B AND RSV
Influenza A, NAA: NOT DETECTED
Influenza B, NAA: NOT DETECTED
RSV, NAA: NOT DETECTED
SARS-CoV-2, NAA: NOT DETECTED

## 2022-08-10 ENCOUNTER — Encounter: Payer: Self-pay | Admitting: Family Medicine

## 2022-08-10 ENCOUNTER — Ambulatory Visit: Payer: BLUE CROSS/BLUE SHIELD | Admitting: Family Medicine

## 2022-08-10 VITALS — BP 126/83 | HR 86 | Temp 97.7°F | Resp 16 | Wt 148.2 lb

## 2022-08-10 DIAGNOSIS — E1165 Type 2 diabetes mellitus with hyperglycemia: Secondary | ICD-10-CM

## 2022-08-10 DIAGNOSIS — F329 Major depressive disorder, single episode, unspecified: Secondary | ICD-10-CM | POA: Diagnosis not present

## 2022-08-10 LAB — POCT GLYCOSYLATED HEMOGLOBIN (HGB A1C): Hemoglobin A1C: 6.6 % — AB (ref 4.0–5.6)

## 2022-08-10 NOTE — Progress Notes (Signed)
Patient is here for their 3 month follow-up Patient has no concerns today Care gaps have been discussed with patient  

## 2022-08-16 ENCOUNTER — Encounter: Payer: Self-pay | Admitting: Family Medicine

## 2022-08-16 NOTE — Progress Notes (Signed)
Established Patient Office Visit  Subjective    Patient ID: Todd [name], male    DOB: [birth year]  Age: 62 y.o. MRN: [id number]  CC:  Chief Complaint  Patient presents with   Follow-up    HPI Todd [name] presents for follow up of diabetes. Patient denies acute complaints or concerns.    Outpatient Encounter Medications as of 08/10/2022  Medication Sig   glipiZIDE (GLUCOTROL) 10 MG tablet Take 1 tablet (10 mg total) by mouth 2 (two) times daily before a meal.   meloxicam (MOBIC) 7.5 MG tablet Take 1 tablet (7.5 mg total) by mouth daily.   metFORMIN (GLUCOPHAGE-XR) 500 MG 24 hr tablet Take 1 tablet (500 mg total) by mouth 2 (two) times daily.   mirtazapine (REMERON) 15 MG tablet Take 1 tablet (15 mg total) by mouth at bedtime.   pantoprazole (PROTONIX) 40 MG tablet Take 1 tablet (40 mg total) by mouth daily.   sildenafil (VIAGRA) 100 MG tablet 1 tablet as needed Orally Once a day for 30 day(s)   tadalafil (CIALIS) 20 MG tablet Take 0.5-1 tablets (10-20 mg total) by mouth every other day as needed for erectile dysfunction.   traMADol (ULTRAM) 50 MG tablet Take 1 tablet (50 mg total) by mouth 2 (two) times daily as needed.   No facility-administered encounter medications on file as of 08/10/2022.    Past Medical History:  Diagnosis Date   Diabetes mellitus without complication [facility])    Male erectile disorder     Past Surgical History:  Procedure Laterality Date   NO PAST SURGERIES      Family History  Problem Relation Age of Onset   Diabetes Father    Diabetes Brother     Social History   Socioeconomic History   Marital status: Single    Spouse name: Not on file   Number of children: Not on file   Years of education: Not on file   Highest education level: Not on file  Occupational History   Not on file  Tobacco Use   Smoking status: Never   Smokeless tobacco: Never  Substance and Sexual Activity   Alcohol use: Yes   Drug use: No   Sexual activity: Yes     Birth control/protection: None    Comment: number of sex partners in the last 12 months 4  Other Topics Concern   Not on file  Social History Narrative   Not on file   Social Determinants of Health   Financial Resource Strain: Not on file  Food Insecurity: Not on file  Transportation Needs: Not on file  Physical Activity: Not on file  Stress: Not on file  Social Connections: Not on file  Intimate Partner Violence: Not on file    Review of Systems  All other systems reviewed and are negative.       Objective    BP 126/83   Pulse 86   Temp 97.7 F (36.5 C) (Oral)   Resp 16   Wt 148 lb 3.2 oz (67.2 kg)   SpO2 98%   BMI 22.53 kg/m   Physical Exam Vitals and nursing note reviewed.  Constitutional:      General: He is not in acute distress. HENT:     Mouth/Throat:     Mouth: Mucous membranes are moist.     Pharynx: Oropharynx is clear.  Cardiovascular:     Rate and Rhythm: Normal rate and regular rhythm.  Pulmonary:     Effort: Pulmonary  effort is normal.     Breath sounds: Normal breath sounds.  Abdominal:     Palpations: Abdomen is soft.     Tenderness: There is no abdominal tenderness.  Neurological:     General: No focal deficit present.     Mental Status: He is alert and oriented to person, place, and time.         Assessment & Plan:   1. Type 2 diabetes mellitus with hyperglycemia, without long-term current use of insulin (HCC) Improved A1c and now at goal. Continue and monitor - POCT glycosylated hemoglobin (Hb A1C) - HM DIABETES FOOT EXAM  2. Reactive depression Appears stable. Continue     Return in about 3 months (around 11/10/2022) for follow up.   Becky Sax, MD

## 2022-10-22 DEATH — deceased
